# Patient Record
Sex: Male | Born: 1970 | Race: White | Hispanic: No | Marital: Single | Smoking: Current every day smoker
Health system: Southern US, Community
[De-identification: ages and names within clinical notes are randomized; demographics above are authoritative.]

## PROBLEM LIST (undated history)

## (undated) DIAGNOSIS — E559 Vitamin D deficiency, unspecified: Secondary | ICD-10-CM

## (undated) DIAGNOSIS — Z720 Tobacco use: Secondary | ICD-10-CM

## (undated) DIAGNOSIS — M79609 Pain in unspecified limb: Secondary | ICD-10-CM

## (undated) DIAGNOSIS — E785 Hyperlipidemia, unspecified: Secondary | ICD-10-CM

## (undated) HISTORY — DX: Pain in unspecified limb: M79.609

## (undated) HISTORY — PX: NO PAST SURGERIES: SHX2092

## (undated) HISTORY — DX: Tobacco use: Z72.0

## (undated) HISTORY — DX: Vitamin D deficiency, unspecified: E55.9

---

## 2000-11-21 ENCOUNTER — Encounter: Payer: Self-pay | Admitting: Family Medicine

## 2000-11-21 ENCOUNTER — Ambulatory Visit (HOSPITAL_COMMUNITY): Admission: RE | Admit: 2000-11-21 | Discharge: 2000-11-21 | Payer: Self-pay | Admitting: Family Medicine

## 2005-01-17 ENCOUNTER — Emergency Department (HOSPITAL_COMMUNITY): Admission: EM | Admit: 2005-01-17 | Discharge: 2005-01-17 | Payer: Self-pay | Admitting: *Deleted

## 2005-06-27 ENCOUNTER — Ambulatory Visit: Payer: Self-pay | Admitting: Nurse Practitioner

## 2006-09-23 ENCOUNTER — Emergency Department (HOSPITAL_COMMUNITY): Admission: EM | Admit: 2006-09-23 | Discharge: 2006-09-23 | Payer: Self-pay | Admitting: Emergency Medicine

## 2007-02-17 ENCOUNTER — Emergency Department (HOSPITAL_COMMUNITY): Admission: EM | Admit: 2007-02-17 | Discharge: 2007-02-17 | Payer: Self-pay | Admitting: Emergency Medicine

## 2007-02-20 ENCOUNTER — Emergency Department (HOSPITAL_COMMUNITY): Admission: EM | Admit: 2007-02-20 | Discharge: 2007-02-20 | Payer: Self-pay | Admitting: Emergency Medicine

## 2007-02-21 ENCOUNTER — Emergency Department (HOSPITAL_COMMUNITY): Admission: EM | Admit: 2007-02-21 | Discharge: 2007-02-21 | Payer: Self-pay | Admitting: Emergency Medicine

## 2009-05-01 ENCOUNTER — Emergency Department (HOSPITAL_COMMUNITY): Admission: EM | Admit: 2009-05-01 | Discharge: 2009-05-01 | Payer: Self-pay | Admitting: Emergency Medicine

## 2009-05-03 ENCOUNTER — Emergency Department (HOSPITAL_COMMUNITY): Admission: EM | Admit: 2009-05-03 | Discharge: 2009-05-03 | Payer: Self-pay | Admitting: Emergency Medicine

## 2009-05-05 ENCOUNTER — Emergency Department (HOSPITAL_COMMUNITY): Admission: EM | Admit: 2009-05-05 | Discharge: 2009-05-05 | Payer: Self-pay | Admitting: Emergency Medicine

## 2009-05-27 ENCOUNTER — Ambulatory Visit (HOSPITAL_BASED_OUTPATIENT_CLINIC_OR_DEPARTMENT_OTHER): Admission: RE | Admit: 2009-05-27 | Discharge: 2009-05-27 | Payer: Self-pay | Admitting: General Surgery

## 2009-05-27 ENCOUNTER — Encounter (INDEPENDENT_AMBULATORY_CARE_PROVIDER_SITE_OTHER): Payer: Self-pay | Admitting: General Surgery

## 2011-03-20 LAB — CULTURE, ROUTINE-ABSCESS
Culture: NO GROWTH
Gram Stain: NONE SEEN

## 2011-03-20 LAB — ANAEROBIC CULTURE: Gram Stain: NONE SEEN

## 2011-03-20 LAB — POCT HEMOGLOBIN-HEMACUE: Hemoglobin: 15.6 g/dL (ref 13.0–17.0)

## 2011-03-21 LAB — WOUND CULTURE: Culture: NO GROWTH

## 2011-04-25 NOTE — Op Note (Signed)
NAME:  Collin Miller, Collin Miller                  ACCOUNT NO.:  [id number]   MEDICAL RECORD NO.:  [id number]          PATIENT TYPE:  AMB   LOCATION:  DSC                          FACILITY:  [facility]   PHYSICIAN:  Almond Lint, MD       DATE OF BIRTH:  11-Jul-1971   DATE OF PROCEDURE:  DATE OF DISCHARGE:                               OPERATIVE REPORT   PREOPERATIVE DIAGNOSIS:  Right neck mass.   POSTOPERATIVE DIAGNOSIS:  Right neck mass.   PROCEDURE PERFORMED:  Excision of right neck mass.   ANESTHESIA:  General and local.   FINDINGS:  Probable epidermal inclusion cyst with pus.   SPECIMEN:  Right neck mass to Pathology.  Cultures to Micro.   ESTIMATED BLOOD LOSS:  Minimal.   COMPLICATIONS:  None known.   PROCEDURE:  Collin Miller was identified in the holding area and taken to  the operating room where he was placed supine on the operating room  table.  General LMA anesthesia was induced.  The patient's neck was  rotated to the left and clipped, prepped, and draped in a sterile  fashion.  Time-out was performed according to the surgical safety check  list.  When all was correct, we continued.  An ellipse of skin was drawn  around the mass to include the prior drainage site.  This was made with  a #15 blade.  The patient was noted to be extremely oozy.  Despite the  large ellipse of skin, the cyst was entered very superficially.  This  was cultured.  An Allis clamp was used to elevate the skin overlying the  cyst, and the Bovie was used to create flaps.  Once we got underneath  the mass, the flaps were created on the other side.  The mass was then  removed in total with the Bovie.  The skin edges were extremely friable  as well in that part of the skin that had been inspected, and these were  trimmed to create a healthy tissue.  The hemostasis was achieved with  the cautery.  The cavity was irrigated copiously.  Of note, the cyst was  noted to be intradermal and did not extend below the platysma.   The mass  was approximately 1.5 x 2 cm.  The skin was closed with horizontal  mattress sutures and 4-0 nylon.  In this regard, it pulled through the  skin together nicely and helped control the oozing from the skin edges  that occurred with each stitch.  The skin was anesthetized with  lidocaine, mixed with 0.25% Marcaine with epinephrine.  Once the sutures  were in place, the wound was hemostatic.  The area was cleaned, dried,  and dressed with gauze and tape.  The patient was awakened from  anesthesia and taken to the PACU in stable condition.     Almond Lint, MD  Electronically Signed    FB/MEDQ  D:  05/27/2009  T:  05/28/2009  Job:  [id number]

## 2013-04-01 ENCOUNTER — Encounter (HOSPITAL_COMMUNITY): Payer: Self-pay | Admitting: *Deleted

## 2013-04-01 ENCOUNTER — Emergency Department (HOSPITAL_COMMUNITY): Payer: Medicare Other

## 2013-04-01 ENCOUNTER — Emergency Department (HOSPITAL_COMMUNITY)
Admission: EM | Admit: 2013-04-01 | Discharge: 2013-04-01 | Disposition: A | Discharge status: Home / Self Care | Payer: Medicare Other | Attending: Emergency Medicine | Admitting: Emergency Medicine

## 2013-04-01 DIAGNOSIS — S93509A Unspecified sprain of unspecified toe(s), initial encounter: Secondary | ICD-10-CM

## 2013-04-01 DIAGNOSIS — Y92009 Unspecified place in unspecified non-institutional (private) residence as the place of occurrence of the external cause: Secondary | ICD-10-CM | POA: Insufficient documentation

## 2013-04-01 DIAGNOSIS — F17200 Nicotine dependence, unspecified, uncomplicated: Secondary | ICD-10-CM | POA: Insufficient documentation

## 2013-04-01 DIAGNOSIS — S93609A Unspecified sprain of unspecified foot, initial encounter: Secondary | ICD-10-CM | POA: Insufficient documentation

## 2013-04-01 DIAGNOSIS — W010XXA Fall on same level from slipping, tripping and stumbling without subsequent striking against object, initial encounter: Secondary | ICD-10-CM | POA: Insufficient documentation

## 2013-04-01 DIAGNOSIS — Y939 Activity, unspecified: Secondary | ICD-10-CM | POA: Insufficient documentation

## 2013-04-01 NOTE — ED Notes (Signed)
Pt reports tripping and injuring left toe yesterday. Pain 10/10.

## 2013-04-01 NOTE — ED Provider Notes (Signed)
Medical screening examination/treatment/procedure(s) were performed by non-physician practitioner and as supervising physician I was immediately available for consultation/collaboration.   Lyanne Co, MD 04/01/13 1027

## 2013-04-01 NOTE — ED Provider Notes (Signed)
History     CSN: [id number]  Arrival date & time 04/01/13  4098   First MD Initiated Contact with Patient 04/01/13 [phone]      Chief Complaint  Patient presents with  . Toe Injury    (Consider location/radiation/quality/duration/timing/severity/associated sxs/prior treatment) HPI Comments: 42 year old male with no significant past medical history presents to the emergency department complaining of pain in his left second toe after tripping over a garden hose one day ago. Patient states he was out in the yard wearing shoes when this occurred. Pain yesterday was "severe", took 2 Advil and pain decreased to 2/10, worse with walking. Admits to bruising and swelling around his toe. Denies numbness or tingling.   The history is provided by the patient.    History reviewed. No pertinent past medical history.  History reviewed. No pertinent past surgical history.  History reviewed. No pertinent family history.  History  Substance Use Topics  . Smoking status: Current Every Day Smoker -- 0.50 packs/day for 20 years    Types: Cigarettes  . Smokeless tobacco: Not on file  . Alcohol Use: No      Review of Systems  Musculoskeletal: Positive for joint swelling.       Positive for left toe pain.  Skin: Positive for color change.  Neurological: Negative for numbness.  All other systems reviewed and are negative.    Allergies  Review of patient's allergies indicates no known allergies.  Home Medications  No current outpatient prescriptions on file.  There were no vitals taken for this visit.  Physical Exam  Nursing note and vitals reviewed. Constitutional: He is oriented to person, place, and time. He appears well-developed and well-nourished. No distress.  HENT:  Head: Normocephalic and atraumatic.  Mouth/Throat: Oropharynx is clear and moist.  Eyes: Conjunctivae and EOM are normal.  Neck: Normal range of motion. Neck supple.  Cardiovascular: Normal rate, regular rhythm,  normal heart sounds and intact distal pulses.   Capillary refill < 3 seconds.  Pulmonary/Chest: Effort normal and breath sounds normal.  Musculoskeletal: Normal range of motion. He exhibits no edema.  TTP of entire left second toe, worse at MTP joint with overlying edema and bruising. Full ROM. No deformity.  Neurological: He is alert and oriented to person, place, and time. No sensory deficit.  Skin: Skin is warm and dry. Bruising noted.  Psychiatric: He has a normal mood and affect. His behavior is normal.    ED Course  Procedures (including critical care time)  Labs Reviewed - No data to display Dg Toe 2nd Left  04/01/2013  *RADIOLOGY REPORT*  Clinical Data: Tripped over something at work, pain and second toe  LEFT SECOND TOE  Comparison: None.  Findings: There is no evidence of fracture or dislocation.  There is no evidence of arthropathy or other focal bony abnormality. Soft tissues are unremarkable.  IMPRESSION: Negative.   Original Report Authenticated By: Davonna Belling, M.D.      1. Toe sprain, initial encounter       MDM  Toe sprain- xray negative. Post-op shoe for comfort. Advised rest, ice, elevation, ibuprofen. Patient states understanding of plan and is agreeable.         Trevor Mace, PA-C 04/01/13 1009

## 2015-09-08 ENCOUNTER — Ambulatory Visit (HOSPITAL_COMMUNITY)
Admission: RE | Admit: 2015-09-08 | Discharge: 2015-09-08 | Disposition: A | Discharge status: Home / Self Care | Payer: Medicare Other | Source: Ambulatory Visit | Attending: Internal Medicine | Admitting: Internal Medicine

## 2015-09-08 ENCOUNTER — Other Ambulatory Visit (HOSPITAL_COMMUNITY): Payer: Self-pay | Admitting: Internal Medicine

## 2015-09-08 DIAGNOSIS — M25561 Pain in right knee: Secondary | ICD-10-CM

## 2015-10-27 ENCOUNTER — Ambulatory Visit (HOSPITAL_COMMUNITY)
Admission: RE | Admit: 2015-10-27 | Discharge: 2015-10-27 | Disposition: A | Discharge status: Home / Self Care | Payer: Medicare Other | Source: Ambulatory Visit | Attending: Internal Medicine | Admitting: Internal Medicine

## 2015-10-27 ENCOUNTER — Other Ambulatory Visit (HOSPITAL_COMMUNITY): Payer: Self-pay | Admitting: Internal Medicine

## 2015-10-27 DIAGNOSIS — M545 Low back pain: Secondary | ICD-10-CM

## 2015-10-27 DIAGNOSIS — M5137 Other intervertebral disc degeneration, lumbosacral region: Secondary | ICD-10-CM | POA: Insufficient documentation

## 2018-03-21 ENCOUNTER — Emergency Department (HOSPITAL_COMMUNITY)
Admission: EM | Admit: 2018-03-21 | Discharge: 2018-03-21 | Discharge status: Absent without leave | Payer: Medicare Other | Source: Home / Self Care

## 2018-03-21 ENCOUNTER — Ambulatory Visit (HOSPITAL_COMMUNITY)
Admission: RE | Admit: 2018-03-21 | Discharge: 2018-03-21 | Disposition: A | Discharge status: Home / Self Care | Payer: Medicare Other | Source: Ambulatory Visit | Attending: Internal Medicine | Admitting: Internal Medicine

## 2018-03-21 ENCOUNTER — Other Ambulatory Visit (HOSPITAL_COMMUNITY): Payer: Self-pay | Admitting: Internal Medicine

## 2018-03-21 DIAGNOSIS — Z720 Tobacco use: Secondary | ICD-10-CM

## 2018-03-21 DIAGNOSIS — J984 Other disorders of lung: Secondary | ICD-10-CM | POA: Diagnosis not present

## 2018-05-06 ENCOUNTER — Emergency Department (HOSPITAL_COMMUNITY): Payer: Medicare Other

## 2018-05-06 ENCOUNTER — Emergency Department (HOSPITAL_COMMUNITY)
Admission: EM | Admit: 2018-05-06 | Discharge: 2018-05-06 | Disposition: A | Discharge status: Home / Self Care | Payer: Medicare Other | Attending: Emergency Medicine | Admitting: Emergency Medicine

## 2018-05-06 ENCOUNTER — Encounter (HOSPITAL_COMMUNITY): Payer: Self-pay | Admitting: *Deleted

## 2018-05-06 DIAGNOSIS — F17210 Nicotine dependence, cigarettes, uncomplicated: Secondary | ICD-10-CM | POA: Insufficient documentation

## 2018-05-06 DIAGNOSIS — Y929 Unspecified place or not applicable: Secondary | ICD-10-CM | POA: Diagnosis not present

## 2018-05-06 DIAGNOSIS — S20211A Contusion of right front wall of thorax, initial encounter: Secondary | ICD-10-CM | POA: Insufficient documentation

## 2018-05-06 DIAGNOSIS — W010XXA Fall on same level from slipping, tripping and stumbling without subsequent striking against object, initial encounter: Secondary | ICD-10-CM | POA: Diagnosis not present

## 2018-05-06 DIAGNOSIS — Z79899 Other long term (current) drug therapy: Secondary | ICD-10-CM | POA: Diagnosis not present

## 2018-05-06 DIAGNOSIS — T148XXA Other injury of unspecified body region, initial encounter: Secondary | ICD-10-CM

## 2018-05-06 DIAGNOSIS — W19XXXA Unspecified fall, initial encounter: Secondary | ICD-10-CM

## 2018-05-06 DIAGNOSIS — Y939 Activity, unspecified: Secondary | ICD-10-CM | POA: Diagnosis not present

## 2018-05-06 DIAGNOSIS — S299XXA Unspecified injury of thorax, initial encounter: Secondary | ICD-10-CM | POA: Diagnosis present

## 2018-05-06 DIAGNOSIS — M7981 Nontraumatic hematoma of soft tissue: Secondary | ICD-10-CM | POA: Diagnosis not present

## 2018-05-06 DIAGNOSIS — Y999 Unspecified external cause status: Secondary | ICD-10-CM | POA: Diagnosis not present

## 2018-05-06 MED ORDER — ACETAMINOPHEN 500 MG PO TABS
1000.0000 mg | ORAL_TABLET | Freq: Once | ORAL | Status: AC
  Administered 2018-05-06: 1000 mg via ORAL
  Filled 2018-05-06: qty 2

## 2018-05-06 NOTE — ED Triage Notes (Signed)
Pt states he slipped and fell at work 2 days ago, now complains of pain in right rib cage area and right hip. Pt denies loss of consciousness or head injury.

## 2018-05-06 NOTE — ED Provider Notes (Signed)
[facility] DEPT Provider Note  CSN: [id number] Arrival date & time: 05/06/18 6010  Chief Complaint(s) Leg Pain; rib cage pain (right); and Fall  HPI Collin Miller is a 47 y.o. male with no pertinent past medical history presents to the emergency department with right rib, right forearm, right upper leg pain following a mechanical fall 2 days ago.  Patient reports that he slipped on wet floor while at work causing him to fall onto his right side.  He denies any head trauma or loss of consciousness.  Pain is been persistent since onset but gradually improving.  Has been taking Motrin for the pain.  Pain is exacerbated with palpation of these regions.  No other alleviating or aggravating factor.  He denies any headache, neck pain, back pain.  No nausea or vomiting.  HPI  Past Medical History History reviewed. No pertinent past medical history. There are no active problems to display for this patient.  Home Medication(s) Prior to Admission medications   Medication Sig Start Date End Date Taking? Authorizing Provider  ibuprofen (ADVIL,MOTRIN) 200 MG tablet Take 200 mg by mouth every 6 (six) hours as needed for pain (pain).    [provider]                                                                                                                                    Past Surgical History History reviewed. No pertinent surgical history. Family History No family history on file.  Social History Social History   Tobacco Use  . Smoking status: Current Every Day Smoker    Packs/day: 0.50    Years: 20.00    Pack years: 10.00    Types: Cigarettes  Substance Use Topics  . Alcohol use: No  . Drug use: No   Allergies Patient has no known allergies.  Review of Systems Review of Systems All other systems are reviewed and are negative for acute change except as noted in the HPI  Physical Exam Vital Signs  I have reviewed the triage vital  signs BP (!) 115/95   Pulse 62   Temp 98 F (36.7 C) (Oral)   Resp 16   SpO2 99%   Physical Exam  Constitutional: He is oriented to person, place, and time. He appears well-developed and well-nourished. No distress.  HENT:  Head: Normocephalic.  Right Ear: External ear normal.  Left Ear: External ear normal.  Mouth/Throat: Oropharynx is clear and moist.  Eyes: Pupils are equal, round, and reactive to light. Conjunctivae and EOM are normal. Right eye exhibits no discharge. Left eye exhibits no discharge. No scleral icterus.  Neck: Normal range of motion. Neck supple.  Cardiovascular: Regular rhythm and normal heart sounds. Exam reveals no gallop and no friction rub.  No murmur heard. Pulses:      Radial pulses are 2+ on the right side, and 2+ on the left side.  Dorsalis pedis pulses are 2+ on the right side, and 2+ on the left side.  Pulmonary/Chest: Effort normal and breath sounds normal. No stridor. No respiratory distress. He exhibits tenderness.    Abdominal: Soft. He exhibits no distension. There is no tenderness.  Musculoskeletal:       Right elbow: He exhibits normal range of motion and no swelling. No tenderness found.       Right hip: He exhibits normal range of motion, no tenderness and no bony tenderness.       Cervical back: He exhibits no bony tenderness.       Thoracic back: He exhibits no bony tenderness.       Lumbar back: He exhibits no bony tenderness.       Right forearm: He exhibits no tenderness.       Arms:      Right upper leg: He exhibits no tenderness.  Clavicle stable. Chest stable to AP/Lat compression. Pelvis stable to Lat compression. No obvious extremity deformity. No chest or abdominal wall contusion.  Neurological: He is alert and oriented to person, place, and time. GCS eye subscore is 4. GCS verbal subscore is 5. GCS motor subscore is 6.  Moving all extremities   Skin: Skin is warm. He is not diaphoretic.    ED Results and  Treatments Labs (all labs ordered are listed, but only abnormal results are displayed) Labs Reviewed - No data to display                                                                                                                       EKG  EKG Interpretation  Date/Time:    Ventricular Rate:    PR Interval:    QRS Duration:   QT Interval:    QTC Calculation:   R Axis:     Text Interpretation:        Radiology Dg Ribs Unilateral W/chest Right  Result Date: 05/06/2018 CLINICAL DATA:  Septum fell at work 2 days ago. Right anterior chest wall pain. EXAM: RIGHT RIBS AND CHEST - 3+ VIEW COMPARISON:  03/21/2018 FINDINGS: No rib fracture or rib lesion. Normal heart, mediastinum and hila. Clear lungs. No pleural effusion or pneumothorax. IMPRESSION: Negative. Electronically Signed   By: Lajean Manes M.D.   On: 05/06/2018 12:19   Pertinent labs & imaging results that were available during my care of the patient were reviewed by me and considered in my medical decision making (see chart for details).  Medications Ordered in ED Medications  acetaminophen (TYLENOL) tablet 1,000 mg (1,000 mg Oral Given 05/06/18 1239)  Procedures Procedures  (including critical care time)  Medical Decision Making / ED Course I have reviewed the nursing notes for this encounter and the patient's prior records (if available in EHR or on provided paperwork).    Mechanical fall resulting in right rib, right forearm, right upper leg pain.  Likely muscular contusion.  Plain film of the right ribs without evidence of fracture.  No significant tenderness to palpation concerning for fracture dislocation.  His pain appears to be soft tissue in nature.  Recommended continued Motrin use as well as applying ice.  The patient appears reasonably screened and/or stabilized for discharge and I  doubt any other medical condition or other [facility] requiring further screening, evaluation, or treatment in the ED at this time prior to discharge.  The patient is safe for discharge with strict return precautions.   Final Clinical Impression(s) / ED Diagnoses Final diagnoses:  Fall, initial encounter  Contusion of right front wall of thorax, initial encounter  Contusion of soft tissue   Disposition: Discharge  Condition: Good  I have discussed the results, Dx and Tx plan with the patient who expressed understanding and agree(s) with the plan. Discharge instructions discussed at great length. The patient was given strict return precautions who verbalized understanding of the instructions. No further questions at time of discharge.    ED Discharge Orders    None       Follow Up: Rogers Blocker, MD [place] [id number] [phone]  Schedule an appointment as soon as possible for a visit        This chart was dictated using voice recognition software.  Despite best efforts to proofread,  errors can occur which can change the documentation meaning.   Fatima Blank, MD 05/06/18 1311

## 2018-05-06 NOTE — Discharge Instructions (Addendum)
You may use over-the-counter Motrin (Ibuprofen), Acetaminophen (Tylenol), topical muscle creams such as SalonPas, Icy Hot, Bengay, etc. Please stretch, apply heat, and have massage therapy for additional assistance. ° °

## 2018-11-15 ENCOUNTER — Emergency Department (HOSPITAL_COMMUNITY)
Admission: EM | Admit: 2018-11-15 | Discharge: 2018-11-15 | Disposition: A | Discharge status: Home / Self Care | Payer: Medicare Other | Attending: Emergency Medicine | Admitting: Emergency Medicine

## 2018-11-15 ENCOUNTER — Encounter (HOSPITAL_COMMUNITY): Payer: Self-pay | Admitting: Emergency Medicine

## 2018-11-15 DIAGNOSIS — R55 Syncope and collapse: Secondary | ICD-10-CM | POA: Diagnosis present

## 2018-11-15 DIAGNOSIS — F17210 Nicotine dependence, cigarettes, uncomplicated: Secondary | ICD-10-CM | POA: Insufficient documentation

## 2018-11-15 HISTORY — DX: Hyperlipidemia, unspecified: E78.5

## 2018-11-15 LAB — CBG MONITORING, ED: Glucose-Capillary: 84 mg/dL (ref 70–99)

## 2018-11-15 NOTE — ED Provider Notes (Signed)
[facility] DEPT Provider Note   CSN: [id number] Arrival date & time: 11/15/18  1152     History   Chief Complaint Chief Complaint  Patient presents with  . Loss of Consciousness    HPI Collin Miller is a 47 y.o. male.  Patient was brought in by ambulance from his primary care doctor's office after he had a syncopal event while having blood drawn.  During the blood draw he said he felt acutely lightheaded and nauseous and sweaty and then fell to the floor.  He thinks he was out no longer than a minute and feels back to baseline now.  EMS apparently gave him 500 cc bolus in route.  He was at the doctor's office for routine appointment and had not had anything to eat today.  He is never had a syncopal event before.  There was no preceding chest pain.  He currently has no complaints and feels back to baseline.  The history is provided by the patient.  Loss of Consciousness   This is a new problem. The current episode started less than 1 hour ago. The problem has been resolved. He lost consciousness for a period of less than one minute. Associated with: blood draw. Associated symptoms include diaphoresis, light-headedness and nausea. Pertinent negatives include abdominal pain, bladder incontinence, bowel incontinence, chest pain, fever and focal weakness. He has tried certain positions for the symptoms. The treatment provided significant relief.    Past Medical History:  Diagnosis Date  . Hyperlipidemia     There are no active problems to display for this patient.   History reviewed. No pertinent surgical history.      Home Medications    Prior to Admission medications   Medication Sig Start Date End Date Taking? Authorizing Provider  ibuprofen (ADVIL,MOTRIN) 200 MG tablet Take 200 mg by mouth every 6 (six) hours as needed for pain (pain).    [provider]    Family History No family history on file.  Social History Social History    Tobacco Use  . Smoking status: Current Every Day Smoker    Packs/day: 0.50    Years: 20.00    Pack years: 10.00    Types: Cigarettes  Substance Use Topics  . Alcohol use: No  . Drug use: No     Allergies   Patient has no known allergies.   Review of Systems Review of Systems  Constitutional: Positive for diaphoresis. Negative for fever.  HENT: Negative for sore throat.   Eyes: Negative for visual disturbance.  Respiratory: Negative for shortness of breath.   Cardiovascular: Positive for syncope. Negative for chest pain.  Gastrointestinal: Positive for nausea. Negative for abdominal pain and bowel incontinence.  Genitourinary: Negative for bladder incontinence and dysuria.  Musculoskeletal: Negative for neck pain.  Skin: Negative for rash.  Neurological: Positive for syncope and light-headedness. Negative for focal weakness.     Physical Exam Updated Vital Signs BP 113/83 (BP Location: Right Arm)   Pulse 81   Temp (!) 97.2 F (36.2 C) (Oral)   Resp 17   SpO2 100%   Physical Exam  Constitutional: He is oriented to person, place, and time. He appears well-developed and well-nourished.  HENT:  Head: Normocephalic and atraumatic.  Eyes: Conjunctivae are normal.  Neck: Neck supple.  Cardiovascular: Normal rate, regular rhythm and normal heart sounds.  No murmur heard. Pulmonary/Chest: Effort normal and breath sounds normal. No respiratory distress.  Abdominal: Soft. There is no tenderness.  Musculoskeletal: He exhibits no edema, tenderness or deformity.  Neurological: He is alert and oriented to person, place, and time. He has normal strength. No cranial nerve deficit or sensory deficit. GCS eye subscore is 4. GCS verbal subscore is 5. GCS motor subscore is 6.  Skin: Skin is warm and dry.  Psychiatric: He has a normal mood and affect.  Nursing note and vitals reviewed.    ED Treatments / Results  Labs (all labs ordered are listed, but only abnormal results  are displayed) Labs Reviewed  CBG MONITORING, ED    EKG EKG Interpretation  Date/Time:  Friday November 15 2018 12:17:28 EST Ventricular Rate:  76 PR Interval:  126 QRS Duration: 94 QT Interval:  392 QTC Calculation: 441 R Axis:   69 Text Interpretation:  Normal sinus rhythm Normal ECG no prior to compare with Confirmed by Aletta Edouard [phone]) on 11/15/2018 12:27:33 PM   Radiology No results found.  Procedures Procedures (including critical care time)  Medications Ordered in ED Medications - No data to display   Initial Impression / Assessment and Plan / ED Course  I have reviewed the triage vital signs and the nursing notes.  Pertinent labs & imaging results that were available during my care of the patient were reviewed by me and considered in my medical decision making (see chart for details).  Clinical Course as of Nov 15 1529  Fri Nov 16, 5719  6792 47 year old male with no significant medical history is here after a syncopal event while getting blood drawn at his primary care doctor's office.  He is a normal-appearing EKG and he otherwise feels back to baseline.  I think this is likely vagal and does not require extensive testing.  We will check a blood sugar and check orthostatics.   [MB]    Clinical Course User Index [MB] Hayden Rasmussen, MD    Final Clinical Impressions(s) / ED Diagnoses   Final diagnoses:  Vasovagal syncope    ED Discharge Orders    None       Hayden Rasmussen, MD 11/15/18 1531

## 2018-11-15 NOTE — Discharge Instructions (Addendum)
You were evaluated in the emergency department for a fainting spell that occurred while you were in your primary care doctor's office getting blood drawn.  Your EKG here was unremarkable along with a normal blood sugar.  Your vital signs showed that your blood pressure remained stable.  Will be important for you to get some extra fluids and food in your system.  Follow-up with your doctor and return if any concerns.

## 2018-11-15 NOTE — ED Triage Notes (Signed)
Per EMS-states patient was having labs drawn afterwards he was standing at a desk and passed out-patient fell to floor-500 cc bolus given in route-no injury from falling-patient states he feels fine now

## 2018-11-15 NOTE — ED Notes (Signed)
Bed: [facility] Expected date:  Expected time:  Means of arrival:  Comments: EMS-syncope during blood draw

## 2021-01-15 ENCOUNTER — Emergency Department (HOSPITAL_COMMUNITY): Payer: Medicare Other

## 2021-01-15 ENCOUNTER — Other Ambulatory Visit: Payer: Self-pay

## 2021-01-15 ENCOUNTER — Emergency Department (HOSPITAL_COMMUNITY)
Admission: EM | Admit: 2021-01-15 | Discharge: 2021-01-15 | Disposition: A | Discharge status: Home / Self Care | Payer: Medicare Other | Attending: Emergency Medicine | Admitting: Emergency Medicine

## 2021-01-15 DIAGNOSIS — R42 Dizziness and giddiness: Secondary | ICD-10-CM | POA: Insufficient documentation

## 2021-01-15 DIAGNOSIS — F17210 Nicotine dependence, cigarettes, uncomplicated: Secondary | ICD-10-CM | POA: Diagnosis not present

## 2021-01-15 DIAGNOSIS — R55 Syncope and collapse: Secondary | ICD-10-CM | POA: Insufficient documentation

## 2021-01-15 LAB — CBC
HCT: 49.3 % (ref 39.0–52.0)
Hemoglobin: 17 g/dL (ref 13.0–17.0)
MCH: 30.6 pg (ref 26.0–34.0)
MCHC: 34.5 g/dL (ref 30.0–36.0)
MCV: 88.7 fL (ref 80.0–100.0)
Platelets: 317 10*3/uL (ref 150–400)
RBC: 5.56 MIL/uL (ref 4.22–5.81)
RDW: 12.3 % (ref 11.5–15.5)
WBC: 13.5 10*3/uL — ABNORMAL HIGH (ref 4.0–10.5)
nRBC: 0 % (ref 0.0–0.2)

## 2021-01-15 LAB — BASIC METABOLIC PANEL
Anion gap: 13 (ref 5–15)
BUN: 11 mg/dL (ref 6–20)
CO2: 20 mmol/L — ABNORMAL LOW (ref 22–32)
Calcium: 9.3 mg/dL (ref 8.9–10.3)
Chloride: 105 mmol/L (ref 98–111)
Creatinine, Ser: 0.99 mg/dL (ref 0.61–1.24)
GFR, Estimated: >60 mL/min (ref >60)
Glucose, Bld: 125 mg/dL — ABNORMAL HIGH (ref 70–99)
Potassium: 3.8 mmol/L (ref 3.5–5.1)
Sodium: 138 mmol/L (ref 135–145)

## 2021-01-15 LAB — TROPONIN I (HIGH SENSITIVITY)
Troponin I (High Sensitivity): 5 ng/L (ref <18)
Troponin I (High Sensitivity): 3 ng/L (ref <18)

## 2021-01-15 LAB — ETHANOL: Alcohol, Ethyl (B): <10 mg/dL (ref <10)

## 2021-01-15 MED ORDER — SODIUM CHLORIDE 0.9 % IV BOLUS
1000.0000 mL | Freq: Once | INTRAVENOUS | Status: AC
  Administered 2021-01-15: 1000 mL via INTRAVENOUS

## 2021-01-15 NOTE — ED Triage Notes (Signed)
Pt presents to ED POV. Pt visiting other family member in ED lobby. Pt had syncopal event. Pt has hematoma to back of head. Dr. Leonette Monarch seeing pt in triage

## 2021-01-15 NOTE — ED Provider Notes (Signed)
[facility] EMERGENCY DEPARTMENT Provider Note  CSN: [id number] Arrival date & time: 01/15/21 0019  Chief Complaint(s) Loss of Consciousness  HPI Collin Miller is a 50 y.o. male who was accompanying his mother here in the emergency department was brought back after syncopal episode in the waiting room.  Patient reports waiting with his mother for over 8 hours.  Reports that he had not had anything to eat or drink since earlier in the morning.  Reported he was standing at the door for several hours when he became lightheaded, dizzy, had tunnel vision prior to passing out.  Upon syncopized in the patient's fell and hit his head on the floor.  He was immediately brought back noted to be hypotensive with systolics in the 65H.  Patient had hematoma in the back of his head.  He denied any headache, neck pain,, back pain, chest pain, shortness of breath.  He denied any recent fevers or infections.  No coughing or congestion.  No nausea or vomiting.  No diarrhea.  Patient reports prior vasovagal episodes in the past.  Due to prolonged wait times in ED volume, patient was seen in the triage.  HPI  Past Medical History Past Medical History:  Diagnosis Date  . Hyperlipidemia    There are no problems to display for this patient.  Home Medication(s) Prior to Admission medications   Medication Sig Start Date End Date Taking? Authorizing Provider  ibuprofen (ADVIL,MOTRIN) 200 MG tablet Take 200 mg by mouth every 6 (six) hours as needed for pain (pain).    [provider]                                                                                                                                    Past Surgical History No past surgical history on file. Family History No family history on file.  Social History Social History   Tobacco Use  . Smoking status: Current Every Day Smoker    Packs/day: 0.50    Years: 20.00    Pack years: 10.00    Types: Cigarettes   Substance Use Topics  . Alcohol use: No  . Drug use: No   Allergies Patient has no known allergies.  Review of Systems Review of Systems All other systems are reviewed and are negative for acute change except as noted in the HPI  Physical Exam Vital Signs  I have reviewed the triage vital signs BP 126/86   Pulse 100   Temp 98.5 F (36.9 C) (Oral)   Resp 16   SpO2 98%   Physical Exam Vitals reviewed.  Constitutional:      General: He is not in acute distress.    Appearance: He is well-developed and well-nourished. He is not diaphoretic.  HENT:     Head: Normocephalic and atraumatic.     Nose: Nose normal.  Eyes:     General: No scleral icterus.  Right eye: No discharge.        Left eye: No discharge.     Extraocular Movements: EOM normal.     Conjunctiva/sclera: Conjunctivae normal.     Pupils: Pupils are equal, round, and reactive to light.  Cardiovascular:     Rate and Rhythm: Normal rate and regular rhythm.     Heart sounds: No murmur heard. No friction rub. No gallop.   Pulmonary:     Effort: Pulmonary effort is normal. No respiratory distress.     Breath sounds: Normal breath sounds. No stridor. No rales.  Abdominal:     General: There is no distension.     Palpations: Abdomen is soft.     Tenderness: There is no abdominal tenderness.  Musculoskeletal:        General: No tenderness or edema.     Cervical back: Normal range of motion and neck supple.  Skin:    General: Skin is warm and dry.     Coloration: Skin is pale.     Findings: No erythema or rash.  Neurological:     Mental Status: He is alert and oriented to person, place, and time.  Psychiatric:        Mood and Affect: Mood and affect normal.     ED Results and Treatments Labs (all labs ordered are listed, but only abnormal results are displayed) Labs Reviewed  BASIC METABOLIC PANEL - Abnormal; Notable for the following components:      Result Value   CO2 20 (*)    Glucose, Bld 125  (*)    All other components within normal limits  CBC - Abnormal; Notable for the following components:   WBC 13.5 (*)    All other components within normal limits  ETHANOL  URINALYSIS, ROUTINE W REFLEX MICROSCOPIC  CBG MONITORING, ED  TROPONIN I (HIGH SENSITIVITY)  TROPONIN I (HIGH SENSITIVITY)                                                                                                                         EKG  Media Information         Document Information  Photos  Ekg   01/15/2021 06:54  Attached To:  Dominica Severin T Amodei   Source Information  Aunika Kirsten, Grayce Sessions, MD  [facility]    Radiology CT Head Wo Contrast  Result Date: 01/15/2021 CLINICAL DATA:  Head trauma.  Normal mental status. EXAM: CT HEAD WITHOUT CONTRAST TECHNIQUE: Contiguous axial images were obtained from the base of the skull through the vertex without intravenous contrast. COMPARISON:  None. FINDINGS: Brain: The brain shows a normal appearance without evidence of malformation, atrophy, old or acute small or large vessel infarction, mass lesion, hemorrhage, hydrocephalus or extra-axial collection. Vascular: No hyperdense vessel. No evidence of atherosclerotic calcification. Skull: Normal.  No traumatic finding.  No focal bone lesion. Sinuses/Orbits: Sinuses are clear. Orbits appear normal. Mastoids are clear. Other: None significant IMPRESSION: Normal head CT. Electronically Signed  By: Nelson Chimes M.D.   On: 01/15/2021 02:31    Pertinent labs & imaging results that were available during my care of the patient were reviewed by me and considered in my medical decision making (see chart for details).  Medications Ordered in ED Medications  sodium chloride 0.9 % bolus 1,000 mL (0 mLs Intravenous Stopped 01/15/21 0214)                                                                                                                                    Procedures Procedures  (including critical care  time)  Medical Decision Making / ED Course I have reviewed the nursing notes for this encounter and the patient's prior records (if available in EHR or on provided paperwork).   Collin Miller was evaluated in Emergency Department on 01/15/2021 for the symptoms described in the history of present illness. He was evaluated in the context of the global COVID-19 pandemic, which necessitated consideration that the patient might be at risk for infection with the SARS-CoV-2 virus that causes COVID-19. Institutional protocols and algorithms that pertain to the evaluation of patients at risk for COVID-19 are in a state of rapid change based on information released by regulatory bodies including the CDC and federal and state organizations. These policies and algorithms were followed during the patient's care in the ED.  Syncopal episode. Patient is hypotensive. No associated chest pain or shortness of breath. Vasovagal prodromal symptoms. Prior episodes in the past.  Likely the etiology. I placed an IV and started patient on IV fluids. Screening labs were obtained. CBG within normal limits EKG without acute ischemic changes, dysrhythmias, blocks. Serial troponins x2 negative. CT head w/o ICH. Labs grossly reassuring without anemia, significant electrolyte derangements or renal sufficiency      Final Clinical Impression(s) / ED Diagnoses Final diagnoses:  Syncope and collapse   The patient appears reasonably screened and/or stabilized for discharge and I doubt any other medical condition or other [facility] requiring further screening, evaluation, or treatment in the ED at this time prior to discharge. Safe for discharge with strict return precautions.  Disposition: Discharge  Condition: Good  I have discussed the results, Dx and Tx plan with the patient/family who expressed understanding and agree(s) with the plan. Discharge instructions discussed at length. The patient/family was given strict return  precautions who verbalized understanding of the instructions. No further questions at time of discharge.    ED Discharge Orders    None        Follow Up: Rogers Blocker, MD [place] [phone]  Call  To schedule an appointment for close follow up, As needed      This chart was dictated using voice recognition software.  Despite best efforts to proofread,  errors can occur which can change the documentation meaning.   Fatima Blank, MD 01/15/21 [phone]

## 2021-07-26 ENCOUNTER — Encounter: Payer: Self-pay | Admitting: Gastroenterology

## 2021-08-05 ENCOUNTER — Ambulatory Visit: Payer: Medicare Other | Admitting: Gastroenterology

## 2021-08-25 ENCOUNTER — Encounter: Payer: Self-pay | Admitting: Gastroenterology

## 2021-09-27 ENCOUNTER — Ambulatory Visit: Payer: Medicare Other | Admitting: Gastroenterology

## 2021-10-19 ENCOUNTER — Ambulatory Visit (INDEPENDENT_AMBULATORY_CARE_PROVIDER_SITE_OTHER): Payer: Medicare Other | Admitting: Gastroenterology

## 2021-10-19 ENCOUNTER — Encounter: Payer: Self-pay | Admitting: Gastroenterology

## 2021-10-19 VITALS — BP 114/80 | HR 72 | Ht 71.0 in | Wt 176.0 lb

## 2021-10-19 DIAGNOSIS — R195 Other fecal abnormalities: Secondary | ICD-10-CM | POA: Diagnosis not present

## 2021-10-19 MED ORDER — SUTAB 1479-225-188 MG PO TABS: 1.0000 | ORAL_TABLET | Freq: Once | ORAL | 0 refills | Status: AC

## 2021-10-19 NOTE — Progress Notes (Signed)
HPI : Collin Miller is a pleasant 50 year old male with a history of chronic tobacco use and hyperlipidemia who was referred to [place] by Dr. Kevan Ny for positive fecal occult blood test.  I do not have the records of this positive test, but the patient indicates he remembers taking a stool based test at home and returning it to his providers laboratory several months ago being told it was positive.  The patient denies seeing any overt GI bleeding.  No history of bright red blood in the stool or on the toilet paper.  No history of melenic appearing stool.  He denies any chronic GI symptoms such as abdominal pain, constipation or diarrhea.  He has no family history of colon cancer.  He denies any chronic upper GI symptoms such as frequent heartburn, acid regurgitation, nausea/vomiting or dysphagia.  He denies any known cardiopulmonary comorbidities, and denies any symptoms concerning for an underlying diagnosis such as chest pain/pressure, dyspnea, lightheadedness/dizziness, palpitations.  He denies any symptoms related to his chronic tobacco use, to include chronic cough, phlegm production, wheezing.   Past Medical History:  Diagnosis Date   Hyperlipidemia    Limb pain    Tobacco abuse    Vitamin D deficiency      Past Surgical History:  Procedure Laterality Date   NO PAST SURGERIES     Family History  Problem Relation Age of Onset   Hyperlipidemia Mother    Prostate cancer Maternal Uncle    Diabetes Maternal Uncle        x 3   Social History   Tobacco Use   Smoking status: Every Day    Packs/day: 0.50    Years: 20.00    Pack years: 10.00    Types: Cigarettes   Smokeless tobacco: Never  Vaping Use   Vaping Use: Never used  Substance Use Topics   Alcohol use: Yes    Comment: occasional   Drug use: No   Current Outpatient Medications  Medication Sig Dispense Refill   acetaminophen (TYLENOL) 325 MG tablet Take 325-650 mg by mouth as needed.     atorvastatin (LIPITOR) 10 MG  tablet Take 10 mg by mouth daily.     cetirizine (ZYRTEC) 10 MG tablet Take 10 mg by mouth daily.     Cholecalciferol (VITAMIN D3) 10 MCG (400 UNIT) CAPS Take 1 tablet by mouth daily.     ibuprofen (ADVIL,MOTRIN) 200 MG tablet Take 200 mg by mouth as needed for pain (pain).     No current facility-administered medications for this visit.   Allergies  Allergen Reactions   Oxycodone Other (See Comments)    "Makes me feel funny"     Review of Systems: All systems reviewed and negative except where noted in HPI.    No results found.  Physical Exam: BP 114/80 (BP Location: Left Arm, Patient Position: Sitting, Cuff Size: Normal)   Pulse 72   Ht 5\' 11"  (1.803 m)   Wt 176 lb (79.8 kg)   BMI 24.55 kg/m  Constitutional: Pleasant,well-developed, Caucasian male in no acute distress. HEENT: Normocephalic and atraumatic. Conjunctivae are normal. No scleral icterus.  Poor dentition, no loose or cracked teeth Cardiovascular: Normal rate, regular rhythm.  Pulmonary/chest: Effort normal and breath sounds normal. No wheezing, rales or rhonchi. Abdominal: Soft, nondistended, nontender. Bowel sounds active throughout. There are no masses palpable. No hepatomegaly. Extremities: no edema Neurological: Alert and oriented to person place and time. Skin: Skin is warm and dry. No rashes noted. Psychiatric:  Normal mood and affect. Behavior is normal.  CBC    Component Value Date/Time   WBC 13.5 (H) 01/15/2021 0054   RBC 5.56 01/15/2021 0054   HGB 17.0 01/15/2021 0054   HCT 49.3 01/15/2021 0054   PLT 317 01/15/2021 0054   MCV 88.7 01/15/2021 0054   MCH 30.6 01/15/2021 0054   MCHC 34.5 01/15/2021 0054   RDW 12.3 01/15/2021 0054    CMP     Component Value Date/Time   NA 138 01/15/2021 0054   K 3.8 01/15/2021 0054   CL 105 01/15/2021 0054   CO2 20 (L) 01/15/2021 0054   GLUCOSE 125 (H) 01/15/2021 0054   BUN 11 01/15/2021 0054   CREATININE 0.99 01/15/2021 0054   CALCIUM 9.3 01/15/2021 0054    GFRNONAA >60 01/15/2021 0054     ASSESSMENT AND PLAN: 50 year old male with reported positive fecal occult blood test.  No family history of colon cancer, no GI symptoms.  We will schedule patient for routine colonoscopy.  He has no cardiopulmonary comorbidities or symptoms to preclude an elective sedated procedure  Positive fecal occult blood test - Colonoscopy  The details, risks (including bleeding, perforation, infection, missed lesions, medication reactions and possible hospitalization or surgery if complications occur), benefits, and alternatives to colonoscopy with possible biopsy and possible polypectomy were discussed with the patient and he/she consents to proceed.   Yeshua Stryker E. Candis Schatz, MD [facility]  Rogers Blocker, MD

## 2021-10-19 NOTE — Patient Instructions (Signed)
If you are age 50 or older, your body mass index should be between 23-30. Your Body mass index is 24.55 kg/m. If this is out of the aforementioned range listed, please consider follow up with your Primary Care Provider.  If you are age 55 or younger, your body mass index should be between 19-25. Your Body mass index is 24.55 kg/m. If this is out of the aformentioned range listed, please consider follow up with your Primary Care Provider.   You have been scheduled for a colonoscopy. Please follow written instructions given to you at your visit today.  Please pick up your prep supplies at the pharmacy within the next 1-3 days. If you use inhalers (even only as needed), please bring them with you on the day of your procedure.   The  GI providers would like to encourage you to use [facility] to communicate with providers for non-urgent requests or questions.  Due to long hold times on the telephone, sending your provider a message by [facility] may be a faster and more efficient way to get a response.  Please allow 48 business hours for a response.  Please remember that this is for non-urgent requests.   Due to recent changes in healthcare laws, you may see the results of your imaging and laboratory studies on MyChart before your provider has had a chance to review them.  We understand that in some cases there may be results that are confusing or concerning to you. Not all laboratory results come back in the same time frame and the provider may be waiting for multiple results in order to interpret others.  Please give [place] 48 hours in order for your provider to thoroughly review all the results before contacting the office for clarification of your results.   It was a pleasure to see you today!  Thank you for trusting me with your gastrointestinal care!    Scott E.Candis Schatz, MD

## 2021-11-21 ENCOUNTER — Ambulatory Visit (AMBULATORY_SURGERY_CENTER): Payer: Medicare Other | Admitting: Gastroenterology

## 2021-11-21 ENCOUNTER — Encounter: Payer: Self-pay | Admitting: Gastroenterology

## 2021-11-21 ENCOUNTER — Other Ambulatory Visit: Payer: Self-pay

## 2021-11-21 VITALS — BP 100/75 | HR 77 | Temp 97.5°F | Resp 14 | Ht 71.0 in | Wt 176.0 lb

## 2021-11-21 DIAGNOSIS — R195 Other fecal abnormalities: Secondary | ICD-10-CM

## 2021-11-21 DIAGNOSIS — K5140 Inflammatory polyps of colon without complications: Secondary | ICD-10-CM

## 2021-11-21 DIAGNOSIS — D120 Benign neoplasm of cecum: Secondary | ICD-10-CM

## 2021-11-21 DIAGNOSIS — D122 Benign neoplasm of ascending colon: Secondary | ICD-10-CM

## 2021-11-21 HISTORY — PX: COLONOSCOPY: SHX174

## 2021-11-21 MED ORDER — SODIUM CHLORIDE 0.9 % IV SOLN: 500.0000 mL | Freq: Once | INTRAVENOUS | Status: DC

## 2021-11-21 NOTE — Patient Instructions (Signed)
Handout on Polyps given.  YOU HAD AN ENDOSCOPIC PROCEDURE TODAY AT [facility]:   Refer to the procedure report that was given to you for any specific questions about what was found during the examination.  If the procedure report does not answer your questions, please call your gastroenterologist to clarify.  If you requested that your care partner not be given the details of your procedure findings, then the procedure report has been included in a sealed envelope for you to review at your convenience later.  YOU SHOULD EXPECT: Some feelings of bloating in the abdomen. Passage of more gas than usual.  Walking can help get rid of the air that was put into your GI tract during the procedure and reduce the bloating. If you had a lower endoscopy (such as a colonoscopy or flexible sigmoidoscopy) you may notice spotting of blood in your stool or on the toilet paper. If you underwent a bowel prep for your procedure, you may not have a normal bowel movement for a few days.  Please Note:  You might notice some irritation and congestion in your nose or some drainage.  This is from the oxygen used during your procedure.  There is no need for concern and it should clear up in a day or so.  SYMPTOMS TO REPORT IMMEDIATELY:  Following lower endoscopy (colonoscopy or flexible sigmoidoscopy):  Excessive amounts of blood in the stool  Significant tenderness or worsening of abdominal pains  Swelling of the abdomen that is new, acute  Fever of 100F or higher   For urgent or emergent issues, a gastroenterologist can be reached at any hour by calling [phone]. Do not use MyChart messaging for urgent concerns.    DIET:  We do recommend a small meal at first, but then you may proceed to your regular diet.  Drink plenty of fluids but you should avoid alcoholic beverages for 24 hours.  ACTIVITY:  You should plan to take it easy for the rest of today and you should NOT DRIVE or use heavy  machinery until tomorrow (because of the sedation medicines used during the test).    FOLLOW UP: Our staff will call the number listed on your records 48-72 hours following your procedure to check on you and address any questions or concerns that you may have regarding the information given to you following your procedure. If we do not reach you, we will leave a message.  We will attempt to reach you two times.  During this call, we will ask if you have developed any symptoms of COVID 19. If you develop any symptoms (ie: fever, flu-like symptoms, shortness of breath, cough etc.) before then, please call [phone].  If you test positive for Covid 19 in the 2 weeks post procedure, please call and report this information to [place].    If any biopsies were taken you will be contacted by phone or by letter within the next 1-3 weeks.  Please call us at [phone] if you have not heard about the biopsies in 3 weeks.    SIGNATURES/CONFIDENTIALITY: You and/or your care partner have signed paperwork which will be entered into your electronic medical record.  These signatures attest to the fact that that the information above on your After Visit Summary has been reviewed and is understood.  Full responsibility of the confidentiality of this discharge information lies with you and/or your care-partner.

## 2021-11-21 NOTE — Op Note (Signed)
[facility] Patient Name: Collin Miller Procedure Date: 11/21/2021 3:26 PM MRN: [id number] Endoscopist: Nicki Reaper E. Candis Miller , MD Age: 50 Referring MD:  Date of Birth: Jan 01, 1971 Gender: Male Account #: [id number] Procedure:                Colonoscopy Indications:              Positive fecal immunochemical test Medicines:                Monitored Anesthesia Care Procedure:                Pre-Anesthesia Assessment:                           - Prior to the procedure, a History and Physical                            was performed, and patient medications and                            allergies were reviewed. The patient's tolerance of                            previous anesthesia was also reviewed. The risks                            and benefits of the procedure and the sedation                            options and risks were discussed with the patient.                            All questions were answered, and informed consent                            was obtained. Prior Anticoagulants: The patient has                            taken no previous anticoagulant or antiplatelet                            agents. ASA Grade Assessment: II - A patient with                            mild systemic disease. After reviewing the risks                            and benefits, the patient was deemed in                            satisfactory condition to undergo the procedure.                           After obtaining informed consent, the colonoscope  was passed under direct vision. Throughout the                            procedure, the patient's blood pressure, pulse, and                            oxygen saturations were monitored continuously. The                            Olympus CF-HQ190L (Serial# [id number]) Colonoscope was                            introduced through the anus and advanced to the the                            terminal ileum, with  identification of the                            appendiceal orifice and IC valve. The colonoscopy                            was performed without difficulty. The patient                            tolerated the procedure well. The quality of the                            bowel preparation was adequate. The terminal ileum,                            ileocecal valve, appendiceal orifice, and rectum                            were photographed. Scope In: 3:38:39 PM Scope Out: 4:26:19 PM Scope Withdrawal Time: 0 hours 44 minutes 12 seconds  Total Procedure Duration: 0 hours 47 minutes 40 seconds  Findings:                 The perianal and digital rectal examinations were                            normal. Pertinent negatives include normal                            sphincter tone and no palpable rectal lesions.                           A large friable 35 mm polyp was found in the cecum.                            The polyp was pedunculated and seemed to be arising                            from the appendiceal orifice. Prior to polypectomy  the stalk was successfully injected with 4 mL of a                            1:10,000 solution to reduce bleeding during                            polypectomy. The polyp was removed with a piecemeal                            technique using a hot snare. Resection and                            retrieval were complete using a combination of Roth                            net retrieval and suction. Biopsies of the                            polypectomy site and surrounding mucosa were taken                            with a cold forceps for histology. Estimated blood                            loss was minimal.                           A 3 mm polyp was found in the ascending colon. The                            polyp was sessile. The polyp was removed with a                            cold snare. Resection and retrieval  were complete.                            Estimated blood loss was minimal.                           The exam was otherwise normal throughout the                            examined colon.                           The terminal ileum appeared normal.                           The retroflexed view of the distal rectum and anal                            verge was normal and showed no anal or rectal  abnormalities. Complications:            No immediate complications. Estimated Blood Loss:     Estimated blood loss was minimal. Impression:               - One 35 mm polyp in the cecum, removed piecemeal                            using a hot snare. Resected and retrieved.                            Injected. Biopsied.                           - One 3 mm polyp in the ascending colon, removed                            with a cold snare. Resected and retrieved.                           - The examined portion of the ileum was normal.                           - The distal rectum and anal verge are normal on                            retroflexion view. Recommendation:           - Patient has a contact number available for                            emergencies. The signs and symptoms of potential                            delayed complications were discussed with the                            patient. Return to normal activities tomorrow.                            Written discharge instructions were provided to the                            patient.                           - Resume previous diet.                           - Continue present medications.                           - Await pathology results. Further recommendations                            will be based on pathology results. Collin Merrick E. Candis Schatz, MD 11/21/2021 4:35:18 PM This  report has been signed electronically.

## 2021-11-21 NOTE — Progress Notes (Signed)
VS completed by CW.   Pt's states no medical or surgical changes since previsit or office visit.  

## 2021-11-21 NOTE — Progress Notes (Signed)
Called to room to assist during endoscopic procedure.  Patient ID and intended procedure confirmed with present staff. Received instructions for my participation in the procedure from the performing physician.  

## 2021-11-21 NOTE — Progress Notes (Signed)
PT taken to PACU. Monitors in place. VSS. Report given to RN. 

## 2021-11-21 NOTE — Progress Notes (Signed)
[facility] History and Physical   Primary Care Physician:  Rogers Blocker, MD   Reason for Procedure:   Positive Fecal occult blood test  Plan:    Colonoscopy to evaluate positive FOBT     HPI: Collin Miller is a 50 y.o. male undergoing colonoscopy because of positive FOBT.  He has no family history of colon cancer and no chronic GI symptoms.    Past Medical History:  Diagnosis Date   Hyperlipidemia    Limb pain    Tobacco abuse    Vitamin D deficiency     Past Surgical History:  Procedure Laterality Date   NO PAST SURGERIES      Prior to Admission medications   Medication Sig Start Date End Date Taking? Authorizing Provider  atorvastatin (LIPITOR) 10 MG tablet Take 10 mg by mouth daily.   Yes [provider]  Cholecalciferol (VITAMIN D3) 10 MCG (400 UNIT) CAPS Take 1 tablet by mouth daily.   Yes [provider]  acetaminophen (TYLENOL) 325 MG tablet Take 325-650 mg by mouth as needed.    [provider]  cetirizine (ZYRTEC) 10 MG tablet Take 10 mg by mouth daily.    [provider]  ibuprofen (ADVIL,MOTRIN) 200 MG tablet Take 200 mg by mouth as needed for pain (pain).    [provider]    Current Outpatient Medications  Medication Sig Dispense Refill   atorvastatin (LIPITOR) 10 MG tablet Take 10 mg by mouth daily.     Cholecalciferol (VITAMIN D3) 10 MCG (400 UNIT) CAPS Take 1 tablet by mouth daily.     acetaminophen (TYLENOL) 325 MG tablet Take 325-650 mg by mouth as needed.     cetirizine (ZYRTEC) 10 MG tablet Take 10 mg by mouth daily.     ibuprofen (ADVIL,MOTRIN) 200 MG tablet Take 200 mg by mouth as needed for pain (pain).     Current Facility-Administered Medications  Medication Dose Route Frequency Provider Last Rate Last Admin   0.9 %  sodium chloride infusion  500 mL Intravenous Once Daryel November, MD        Allergies as of 11/21/2021 - Review Complete 11/21/2021  Allergen Reaction Noted    Oxycodone Other (See Comments) 10/19/2021    Family History  Problem Relation Age of Onset   Hyperlipidemia Mother    Prostate cancer Maternal Uncle    Diabetes Maternal Uncle        x 3   Colon cancer Neg Hx    Stomach cancer Neg Hx    Rectal cancer Neg Hx     Social History   Socioeconomic History   Marital status: Single    Spouse name: Not on file   Number of children: 0   Years of education: Not on file   Highest education level: Not on file  Occupational History   Occupation: [employer]    Comment: Armed forces technical officer  Tobacco Use   Smoking status: Every Day    Packs/day: 0.50    Years: 20.00    Pack years: 10.00    Types: Cigarettes   Smokeless tobacco: Never  Vaping Use   Vaping Use: Never used  Substance and Sexual Activity   Alcohol use: Yes    Comment: occasional   Drug use: No   Sexual activity: Not on file  Other Topics Concern   Not on file  Social History Narrative   Not on file   Social Determinants of Health   Financial  Resource Strain: Not on file  Food Insecurity: Not on file  Transportation Needs: Not on file  Physical Activity: Not on file  Stress: Not on file  Social Connections: Not on file  Intimate Partner Violence: Not on file    Review of Systems:  All other review of systems negative except as mentioned in the HPI.  Physical Exam: Vital signs BP (!) 141/92   Pulse 92   Temp (!) 97.5 F (36.4 C) (Temporal)   Ht 5\' 11"  (1.803 m)   Wt 176 lb (79.8 kg)   SpO2 100%   BMI 24.55 kg/m   General:   Alert,  Well-developed, well-nourished, pleasant and cooperative in NAD Airway:  Mallampati 3 Lungs:  Clear throughout to auscultation.   Heart:  Regular rate and rhythm; no murmurs, clicks, rubs,  or gallops. Abdomen:  Soft, nontender and nondistended. Normal bowel sounds.   Neuro/Psych:  Normal mood and affect. A and O x 3   Bhavika Schnider E. Candis Schatz, MD [facility]

## 2021-11-23 ENCOUNTER — Telehealth: Payer: Self-pay

## 2021-11-23 ENCOUNTER — Telehealth: Payer: Self-pay | Admitting: *Deleted

## 2021-11-23 NOTE — Telephone Encounter (Signed)
°  Follow up Call-  Call back number 11/21/2021  Post procedure Call Back phone  # [phone]  Permission to leave phone message Yes  Some recent data might be hidden     Patient questions:  Do you have a fever, pain , or abdominal swelling? No. Pain Score  0 *  Have you tolerated food without any problems? Yes.    Have you been able to return to your normal activities? Yes.    Do you have any questions about your discharge instructions: Diet   No. Medications  No. Follow up visit  No.  Do you have questions or concerns about your Care? No.  Actions: * If pain score is 4 or above: No action needed, pain <4.

## 2021-11-23 NOTE — Telephone Encounter (Signed)
Unable to leave message mailbox full.

## 2021-11-24 ENCOUNTER — Encounter: Payer: Self-pay | Admitting: Gastroenterology

## 2021-12-17 ENCOUNTER — Observation Stay (HOSPITAL_COMMUNITY)
Admission: EM | Admit: 2021-12-17 | Discharge: 2021-12-19 | Disposition: A | Discharge status: Home / Self Care | Payer: Medicare Other | Attending: General Surgery | Admitting: General Surgery

## 2021-12-17 ENCOUNTER — Encounter (HOSPITAL_COMMUNITY): Payer: Self-pay

## 2021-12-17 ENCOUNTER — Other Ambulatory Visit: Payer: Self-pay

## 2021-12-17 DIAGNOSIS — K3580 Unspecified acute appendicitis: Secondary | ICD-10-CM | POA: Diagnosis not present

## 2021-12-17 DIAGNOSIS — Z20822 Contact with and (suspected) exposure to covid-19: Secondary | ICD-10-CM | POA: Diagnosis not present

## 2021-12-17 DIAGNOSIS — E785 Hyperlipidemia, unspecified: Secondary | ICD-10-CM | POA: Diagnosis not present

## 2021-12-17 DIAGNOSIS — K353 Acute appendicitis with localized peritonitis, without perforation or gangrene: Secondary | ICD-10-CM

## 2021-12-17 DIAGNOSIS — R1031 Right lower quadrant pain: Secondary | ICD-10-CM | POA: Diagnosis present

## 2021-12-17 LAB — COMPREHENSIVE METABOLIC PANEL
ALT: 29 U/L (ref 0–44)
AST: 19 U/L (ref 15–41)
Albumin: 4.3 g/dL (ref 3.5–5.0)
Alkaline Phosphatase: 64 U/L (ref 38–126)
Anion gap: 10 (ref 5–15)
BUN: 14 mg/dL (ref 6–20)
CO2: 25 mmol/L (ref 22–32)
Calcium: 9.5 mg/dL (ref 8.9–10.3)
Chloride: 103 mmol/L (ref 98–111)
Creatinine, Ser: 1.21 mg/dL (ref 0.61–1.24)
GFR, Estimated: >60 mL/min (ref >60)
Glucose, Bld: 156 mg/dL — ABNORMAL HIGH (ref 70–99)
Potassium: 3.8 mmol/L (ref 3.5–5.1)
Sodium: 138 mmol/L (ref 135–145)
Total Bilirubin: 0.9 mg/dL (ref 0.3–1.2)
Total Protein: 7.5 g/dL (ref 6.5–8.1)

## 2021-12-17 LAB — CBC WITH DIFFERENTIAL/PLATELET
Abs Immature Granulocytes: 0.03 10*3/uL (ref 0.00–0.07)
Basophils Absolute: 0 10*3/uL (ref 0.0–0.1)
Basophils Relative: 0 %
Eosinophils Absolute: 0 10*3/uL (ref 0.0–0.5)
Eosinophils Relative: 0 %
HCT: 51.3 % (ref 39.0–52.0)
Hemoglobin: 17.1 g/dL — ABNORMAL HIGH (ref 13.0–17.0)
Immature Granulocytes: 0 %
Lymphocytes Relative: 11 %
Lymphs Abs: 1.5 10*3/uL (ref 0.7–4.0)
MCH: 30.6 pg (ref 26.0–34.0)
MCHC: 33.3 g/dL (ref 30.0–36.0)
MCV: 91.8 fL (ref 80.0–100.0)
Monocytes Absolute: 0.9 10*3/uL (ref 0.1–1.0)
Monocytes Relative: 7 %
Neutro Abs: 11 10*3/uL — ABNORMAL HIGH (ref 1.7–7.7)
Neutrophils Relative %: 82 %
Platelets: 277 10*3/uL (ref 150–400)
RBC: 5.59 MIL/uL (ref 4.22–5.81)
RDW: 12.5 % (ref 11.5–15.5)
WBC: 13.5 10*3/uL — ABNORMAL HIGH (ref 4.0–10.5)
nRBC: 0 % (ref 0.0–0.2)

## 2021-12-17 LAB — LIPASE, BLOOD: Lipase: 32 U/L (ref 11–51)

## 2021-12-17 MED ORDER — FENTANYL CITRATE PF 50 MCG/ML IJ SOSY
50.0000 ug | PREFILLED_SYRINGE | Freq: Once | INTRAMUSCULAR | Status: AC
  Administered 2021-12-17: 50 ug via INTRAVENOUS
  Filled 2021-12-17: qty 1

## 2021-12-17 MED ORDER — SODIUM CHLORIDE 0.9 % IV BOLUS
1000.0000 mL | Freq: Once | INTRAVENOUS | Status: AC
  Administered 2021-12-17: 1000 mL via INTRAVENOUS

## 2021-12-17 MED ORDER — ONDANSETRON HCL 4 MG/2ML IJ SOLN
4.0000 mg | Freq: Once | INTRAMUSCULAR | Status: AC
  Administered 2021-12-17: 4 mg via INTRAVENOUS
  Filled 2021-12-17: qty 2

## 2021-12-17 NOTE — ED Triage Notes (Signed)
Pt BIB EMS with reports of right lower abdominal pain x 3 hrs.

## 2021-12-17 NOTE — ED Notes (Signed)
Patient rates pain 10/10.

## 2021-12-17 NOTE — ED Provider Notes (Signed)
[facility] DEPT Provider Note   CSN: [id number] Arrival date & time: 12/17/21  2117     History  Chief Complaint  Patient presents with   Abdominal Pain    Collin Miller is a 51 y.o. male.  The history is provided by the patient and medical records.  Abdominal Pain Collin Miller is a 51 y.o. male who presents to the Emergency Department complaining of abdominal pain.  He presents emergency department for evaluation of abrupt onset right lower quadrant abdominal pain that began at 630 this evening.  Pain is constant in nature, nonradiating.  No prior similar symptoms.  No associated nausea, vomiting, diarrhea, dysuria.  He has a history of hyperlipidemia and uses tobacco.  No alcohol or drug use.  No prior abdominal surgeries.  No prior similar symptoms.    Home Medications Prior to Admission medications   Medication Sig Start Date End Date Taking? Authorizing Provider  acetaminophen (TYLENOL) 325 MG tablet Take 325-650 mg by mouth as needed.    [provider]  atorvastatin (LIPITOR) 10 MG tablet Take 10 mg by mouth daily.    [provider]  cetirizine (ZYRTEC) 10 MG tablet Take 10 mg by mouth daily.    [provider]  Cholecalciferol (VITAMIN D3) 10 MCG (400 UNIT) CAPS Take 1 tablet by mouth daily.    [provider]  ibuprofen (ADVIL,MOTRIN) 200 MG tablet Take 200 mg by mouth as needed for pain (pain).    [provider]      Allergies    Oxycodone    Review of Systems   Review of Systems  Gastrointestinal:  Positive for abdominal pain.  All other systems reviewed and are negative.  Physical Exam Updated Vital Signs BP 106/77    Pulse (!) 114    Temp (!) 97 F (36.1 C) (Axillary)    Resp 12    SpO2 100%  Physical Exam Vitals and nursing note reviewed.  Constitutional:      Appearance: He is well-developed.  HENT:     Head: Normocephalic and atraumatic.  Cardiovascular:     Rate and Rhythm:  Regular rhythm. Tachycardia present.     Heart sounds: No murmur heard. Pulmonary:     Effort: Pulmonary effort is normal. No respiratory distress.     Breath sounds: Normal breath sounds.  Abdominal:     Palpations: Abdomen is soft.     Tenderness: There is no guarding or rebound.     Comments: Generalized abdominal tenderness with focal peritonitis guarding/rebound in the RLQ  Musculoskeletal:        General: No tenderness.  Skin:    General: Skin is warm and dry.  Neurological:     Mental Status: He is alert and oriented to person, place, and time.  Psychiatric:        Behavior: Behavior normal.    ED Results / Procedures / Treatments   Labs (all labs ordered are listed, but only abnormal results are displayed) Labs Reviewed  CBC WITH DIFFERENTIAL/PLATELET - Abnormal; Notable for the following components:      Result Value   WBC 13.5 (*)    Hemoglobin 17.1 (*)    Neutro Abs 11.0 (*)    All other components within normal limits  COMPREHENSIVE METABOLIC PANEL - Abnormal; Notable for the following components:   Glucose, Bld 156 (*)    All other components within normal limits  LIPASE, BLOOD  URINALYSIS, ROUTINE W REFLEX MICROSCOPIC  EKG None  Radiology No results found.  Procedures Procedures   CRITICAL CARE Performed by: Quintella Reichert   Total critical care time: 35 minutes  Critical care time was exclusive of separately billable procedures and treating other patients.  Critical care was necessary to treat or prevent imminent or life-threatening deterioration.  Critical care was time spent personally by me on the following activities: development of treatment plan with patient and/or surrogate as well as nursing, discussions with consultants, evaluation of patient's response to treatment, examination of patient, obtaining history from patient or surrogate, ordering and performing treatments and interventions, ordering and review of laboratory studies,  ordering and review of radiographic studies, pulse oximetry and re-evaluation of patient's condition.  Medications Ordered in ED Medications  sodium chloride 0.9 % bolus 1,000 mL (has no administration in time range)  fentaNYL (SUBLIMAZE) injection 50 mcg (has no administration in time range)  ondansetron (ZOFRAN) injection 4 mg (has no administration in time range)    ED Course/ Medical Decision Making/ A&P                           Medical Decision Making  Patient here for evaluation of abdominal pain that started earlier this evening.  He does have focal tenderness on examination, mild tachycardia and leukocytosis.  CT abdomen pelvis was obtained, which demonstrates acute appendicitis.  He was treated with broad-spectrum antibiotics given his severe pain and leukocytosis and tachycardia.  He was also treated with IV fluid boluses for tachycardia.  EKG reviewed, sinus rhythm.  Discussed with patient findings of studies recommendation for admission and he is in agreement with treatment plan.  Discussed with Dr. Harlow Asa with general surgery, he plans to evaluate the patient in the emergency department and admit for ongoing treatment.        Final Clinical Impression(s) / ED Diagnoses Final diagnoses:  None    Rx / DC Orders ED Discharge Orders     None         Quintella Reichert, MD 12/18/21 [phone]

## 2021-12-18 ENCOUNTER — Emergency Department (HOSPITAL_COMMUNITY): Payer: Medicare Other

## 2021-12-18 ENCOUNTER — Encounter (HOSPITAL_COMMUNITY): Payer: Self-pay

## 2021-12-18 ENCOUNTER — Observation Stay (HOSPITAL_COMMUNITY): Payer: Medicare Other | Admitting: Certified Registered Nurse Anesthetist

## 2021-12-18 ENCOUNTER — Encounter (HOSPITAL_COMMUNITY)
Admission: EM | Disposition: A | Discharge status: Home / Self Care | Payer: Self-pay | Source: Home / Self Care | Attending: Emergency Medicine

## 2021-12-18 DIAGNOSIS — K3580 Unspecified acute appendicitis: Secondary | ICD-10-CM | POA: Diagnosis present

## 2021-12-18 DIAGNOSIS — E785 Hyperlipidemia, unspecified: Secondary | ICD-10-CM | POA: Diagnosis not present

## 2021-12-18 DIAGNOSIS — Z20822 Contact with and (suspected) exposure to covid-19: Secondary | ICD-10-CM | POA: Diagnosis not present

## 2021-12-18 HISTORY — PX: LAPAROSCOPIC APPENDECTOMY: SHX408

## 2021-12-18 LAB — RESP PANEL BY RT-PCR (FLU A&B, COVID) ARPGX2
Influenza A by PCR: NEGATIVE
Influenza B by PCR: NEGATIVE
SARS Coronavirus 2 by RT PCR: NEGATIVE

## 2021-12-18 LAB — HIV ANTIBODY (ROUTINE TESTING W REFLEX): HIV Screen 4th Generation wRfx: NONREACTIVE

## 2021-12-18 SURGERY — APPENDECTOMY, LAPAROSCOPIC
Anesthesia: General | Site: Abdomen

## 2021-12-18 MED ORDER — FENTANYL CITRATE PF 50 MCG/ML IJ SOSY
PREFILLED_SYRINGE | INTRAMUSCULAR | Status: AC
  Filled 2021-12-18: qty 3

## 2021-12-18 MED ORDER — PROPOFOL 10 MG/ML IV BOLUS
INTRAVENOUS | Status: AC
  Filled 2021-12-18: qty 20

## 2021-12-18 MED ORDER — FENTANYL CITRATE (PF) 250 MCG/5ML IJ SOLN
INTRAMUSCULAR | Status: DC | PRN
  Administered 2021-12-18: 25 ug via INTRAVENOUS
  Administered 2021-12-18: 50 ug via INTRAVENOUS
  Administered 2021-12-18: 25 ug via INTRAVENOUS

## 2021-12-18 MED ORDER — PROPOFOL 10 MG/ML IV BOLUS
INTRAVENOUS | Status: DC | PRN
  Administered 2021-12-18: 200 mg via INTRAVENOUS

## 2021-12-18 MED ORDER — PHENYLEPHRINE 40 MCG/ML (10ML) SYRINGE FOR IV PUSH (FOR BLOOD PRESSURE SUPPORT)
PREFILLED_SYRINGE | INTRAVENOUS | Status: DC | PRN
  Administered 2021-12-18 (×3): 120 ug via INTRAVENOUS

## 2021-12-18 MED ORDER — ROCURONIUM BROMIDE 10 MG/ML (PF) SYRINGE
PREFILLED_SYRINGE | INTRAVENOUS | Status: AC
  Filled 2021-12-18: qty 10

## 2021-12-18 MED ORDER — ACETAMINOPHEN 10 MG/ML IV SOLN
INTRAVENOUS | Status: AC
  Filled 2021-12-18: qty 100

## 2021-12-18 MED ORDER — ONDANSETRON HCL 4 MG/2ML IJ SOLN
INTRAMUSCULAR | Status: DC | PRN
  Administered 2021-12-18: 4 mg via INTRAVENOUS

## 2021-12-18 MED ORDER — IBUPROFEN 400 MG PO TABS: 600.0000 mg | ORAL_TABLET | Freq: Four times a day (QID) | ORAL | Status: DC | PRN

## 2021-12-18 MED ORDER — LACTATED RINGERS IV BOLUS
1000.0000 mL | Freq: Once | INTRAVENOUS | Status: AC
  Administered 2021-12-18: 1000 mL via INTRAVENOUS

## 2021-12-18 MED ORDER — POTASSIUM CHLORIDE IN NACL 20-0.45 MEQ/L-% IV SOLN
INTRAVENOUS | Status: DC
  Filled 2021-12-18 (×3): qty 1000

## 2021-12-18 MED ORDER — ACETAMINOPHEN 10 MG/ML IV SOLN
1000.0000 mg | Freq: Once | INTRAVENOUS | Status: AC
  Administered 2021-12-18: 1000 mg via INTRAVENOUS

## 2021-12-18 MED ORDER — BUPIVACAINE-EPINEPHRINE (PF) 0.25% -1:200000 IJ SOLN
INTRAMUSCULAR | Status: AC
  Filled 2021-12-18: qty 30

## 2021-12-18 MED ORDER — MIDAZOLAM HCL 2 MG/2ML IJ SOLN
INTRAMUSCULAR | Status: DC | PRN
  Administered 2021-12-18: 2 mg via INTRAVENOUS

## 2021-12-18 MED ORDER — PIPERACILLIN-TAZOBACTAM 3.375 G IVPB
3.3750 g | Freq: Three times a day (TID) | INTRAVENOUS | Status: DC
  Administered 2021-12-18 – 2021-12-19 (×4): 3.375 g via INTRAVENOUS
  Filled 2021-12-18 (×5): qty 50

## 2021-12-18 MED ORDER — AMISULPRIDE (ANTIEMETIC) 5 MG/2ML IV SOLN: 10.0000 mg | Freq: Once | INTRAVENOUS | Status: DC | PRN

## 2021-12-18 MED ORDER — ROCURONIUM BROMIDE 10 MG/ML (PF) SYRINGE
PREFILLED_SYRINGE | INTRAVENOUS | Status: DC | PRN
Start: 2021-12-18 — End: 2021-12-18
  Administered 2021-12-18: 40 mg via INTRAVENOUS

## 2021-12-18 MED ORDER — PHENYLEPHRINE HCL (PRESSORS) 10 MG/ML IV SOLN
INTRAVENOUS | Status: AC
  Filled 2021-12-18: qty 1

## 2021-12-18 MED ORDER — TRAMADOL HCL 50 MG PO TABS: 50.0000 mg | ORAL_TABLET | Freq: Four times a day (QID) | ORAL | Status: DC | PRN

## 2021-12-18 MED ORDER — IOHEXOL 350 MG/ML SOLN
80.0000 mL | Freq: Once | INTRAVENOUS | Status: AC | PRN
  Administered 2021-12-18: 80 mL via INTRAVENOUS

## 2021-12-18 MED ORDER — FENTANYL CITRATE (PF) 250 MCG/5ML IJ SOLN
INTRAMUSCULAR | Status: AC
  Filled 2021-12-18: qty 5

## 2021-12-18 MED ORDER — SUCCINYLCHOLINE CHLORIDE 200 MG/10ML IV SOSY
PREFILLED_SYRINGE | INTRAVENOUS | Status: AC
  Filled 2021-12-18: qty 10

## 2021-12-18 MED ORDER — DEXAMETHASONE SODIUM PHOSPHATE 10 MG/ML IJ SOLN
INTRAMUSCULAR | Status: AC
  Filled 2021-12-18: qty 1

## 2021-12-18 MED ORDER — ONDANSETRON HCL 4 MG/2ML IJ SOLN: 4.0000 mg | Freq: Four times a day (QID) | INTRAMUSCULAR | Status: DC | PRN

## 2021-12-18 MED ORDER — ONDANSETRON 4 MG PO TBDP: 4.0000 mg | ORAL_TABLET | Freq: Four times a day (QID) | ORAL | Status: DC | PRN

## 2021-12-18 MED ORDER — FENTANYL CITRATE PF 50 MCG/ML IJ SOSY
25.0000 ug | PREFILLED_SYRINGE | INTRAMUSCULAR | Status: DC | PRN
  Administered 2021-12-18: 50 ug via INTRAVENOUS

## 2021-12-18 MED ORDER — PROMETHAZINE HCL 25 MG/ML IJ SOLN: 6.2500 mg | INTRAMUSCULAR | Status: DC | PRN

## 2021-12-18 MED ORDER — LIDOCAINE HCL (CARDIAC) PF 100 MG/5ML IV SOSY
PREFILLED_SYRINGE | INTRAVENOUS | Status: DC | PRN
Start: 2021-12-18 — End: 2021-12-18
  Administered 2021-12-18: 80 mg via INTRAVENOUS

## 2021-12-18 MED ORDER — FENTANYL CITRATE PF 50 MCG/ML IJ SOSY
50.0000 ug | PREFILLED_SYRINGE | Freq: Once | INTRAMUSCULAR | Status: AC
  Administered 2021-12-18: 50 ug via INTRAVENOUS
  Filled 2021-12-18: qty 1

## 2021-12-18 MED ORDER — ACETAMINOPHEN 650 MG RE SUPP
650.0000 mg | Freq: Once | RECTAL | Status: AC
  Administered 2021-12-18: 650 mg via RECTAL
  Filled 2021-12-18: qty 1

## 2021-12-18 MED ORDER — ONDANSETRON HCL 4 MG/2ML IJ SOLN
INTRAMUSCULAR | Status: AC
  Filled 2021-12-18: qty 2

## 2021-12-18 MED ORDER — ACETAMINOPHEN 325 MG PO TABS
650.0000 mg | ORAL_TABLET | Freq: Four times a day (QID) | ORAL | Status: DC | PRN
  Administered 2021-12-18 – 2021-12-19 (×2): 650 mg via ORAL
  Filled 2021-12-18 (×2): qty 2

## 2021-12-18 MED ORDER — DEXAMETHASONE SODIUM PHOSPHATE 10 MG/ML IJ SOLN
INTRAMUSCULAR | Status: DC | PRN
  Administered 2021-12-18: 5 mg via INTRAVENOUS

## 2021-12-18 MED ORDER — SUCCINYLCHOLINE CHLORIDE 200 MG/10ML IV SOSY
PREFILLED_SYRINGE | INTRAVENOUS | Status: DC | PRN
Start: 2021-12-18 — End: 2021-12-18
  Administered 2021-12-18: 160 mg via INTRAVENOUS

## 2021-12-18 MED ORDER — SUGAMMADEX SODIUM 200 MG/2ML IV SOLN
INTRAVENOUS | Status: DC | PRN
  Administered 2021-12-18: 200 mg via INTRAVENOUS

## 2021-12-18 MED ORDER — PHENYLEPHRINE HCL-NACL 20-0.9 MG/250ML-% IV SOLN
INTRAVENOUS | Status: DC | PRN
Start: 2021-12-18 — End: 2021-12-18
  Administered 2021-12-18: 50 ug/min via INTRAVENOUS

## 2021-12-18 MED ORDER — SODIUM CHLORIDE 0.9 % IV SOLN: INTRAVENOUS | Status: DC | PRN

## 2021-12-18 MED ORDER — 0.9 % SODIUM CHLORIDE (POUR BTL) OPTIME
TOPICAL | Status: DC | PRN
  Administered 2021-12-18: 1000 mL

## 2021-12-18 MED ORDER — BUPIVACAINE-EPINEPHRINE 0.25% -1:200000 IJ SOLN
INTRAMUSCULAR | Status: DC | PRN
  Administered 2021-12-18: 20 mL

## 2021-12-18 MED ORDER — LACTATED RINGERS IR SOLN
Status: DC | PRN
Start: 2021-12-18 — End: 2021-12-18
  Administered 2021-12-18: 1000 mL

## 2021-12-18 MED ORDER — PHENYLEPHRINE 40 MCG/ML (10ML) SYRINGE FOR IV PUSH (FOR BLOOD PRESSURE SUPPORT)
PREFILLED_SYRINGE | INTRAVENOUS | Status: AC
  Filled 2021-12-18: qty 20

## 2021-12-18 MED ORDER — LIDOCAINE 2% (20 MG/ML) 5 ML SYRINGE
INTRAMUSCULAR | Status: AC
  Filled 2021-12-18: qty 5

## 2021-12-18 MED ORDER — ACETAMINOPHEN 650 MG RE SUPP: 650.0000 mg | Freq: Four times a day (QID) | RECTAL | Status: DC | PRN

## 2021-12-18 MED ORDER — MIDAZOLAM HCL 2 MG/2ML IJ SOLN
INTRAMUSCULAR | Status: AC
  Filled 2021-12-18: qty 2

## 2021-12-18 MED ORDER — HYDROMORPHONE HCL 1 MG/ML IJ SOLN: 1.0000 mg | INTRAMUSCULAR | Status: DC | PRN

## 2021-12-18 MED ORDER — LACTATED RINGERS IV SOLN: INTRAVENOUS | Status: DC | PRN

## 2021-12-18 MED ORDER — PIPERACILLIN-TAZOBACTAM 3.375 G IVPB 30 MIN
3.3750 g | Freq: Once | INTRAVENOUS | Status: AC
  Administered 2021-12-18: 3.375 g via INTRAVENOUS
  Filled 2021-12-18: qty 50

## 2021-12-18 SURGICAL SUPPLY — 40 items
APPLIER CLIP ROT 10 11.4 M/L (STAPLE)
BAG COUNTER SPONGE SURGICOUNT (BAG) IMPLANT
CHLORAPREP W/TINT 26 (MISCELLANEOUS) ×2 IMPLANT
CLIP APPLIE ROT 10 11.4 M/L (STAPLE) IMPLANT
COVER SURGICAL LIGHT HANDLE (MISCELLANEOUS) ×2 IMPLANT
CUTTER FLEX LINEAR 45M (STAPLE) IMPLANT
DECANTER SPIKE VIAL GLASS SM (MISCELLANEOUS) ×2 IMPLANT
DERMABOND ADVANCED (GAUZE/BANDAGES/DRESSINGS) ×1
DERMABOND ADVANCED .7 DNX12 (GAUZE/BANDAGES/DRESSINGS) ×1 IMPLANT
DRAPE LAPAROSCOPIC ABDOMINAL (DRAPES) ×2 IMPLANT
ELECT PENCIL ROCKER SW 15FT (MISCELLANEOUS) IMPLANT
ELECT REM PT RETURN 15FT ADLT (MISCELLANEOUS) ×2 IMPLANT
ENDOLOOP SUT PDS II  0 18 (SUTURE)
ENDOLOOP SUT PDS II 0 18 (SUTURE) IMPLANT
GLOVE SURG ORTHO LTX SZ8 (GLOVE) ×2 IMPLANT
GLOVE SURG SYN 7.5  E (GLOVE) ×2
GLOVE SURG SYN 7.5 E (GLOVE) ×1 IMPLANT
GLOVE SURG SYN 7.5 PF PI (GLOVE) ×1 IMPLANT
GOWN STRL REUS W/TWL XL LVL3 (GOWN DISPOSABLE) ×4 IMPLANT
IRRIG SUCT STRYKERFLOW 2 WTIP (MISCELLANEOUS) ×2
IRRIGATION SUCT STRKRFLW 2 WTP (MISCELLANEOUS) ×1 IMPLANT
KIT BASIN OR (CUSTOM PROCEDURE TRAY) ×2 IMPLANT
KIT TURNOVER KIT A (KITS) IMPLANT
POUCH SPECIMEN RETRIEVAL 10MM (ENDOMECHANICALS) ×2 IMPLANT
RELOAD 45 VASCULAR/THIN (ENDOMECHANICALS) IMPLANT
RELOAD STAPLE 45 2.5 WHT GRN (ENDOMECHANICALS) IMPLANT
RELOAD STAPLE 45 3.5 BLU ETS (ENDOMECHANICALS) IMPLANT
RELOAD STAPLE TA45 3.5 REG BLU (ENDOMECHANICALS) ×4 IMPLANT
SET TUBE SMOKE EVAC HIGH FLOW (TUBING) ×2 IMPLANT
SHEARS HARMONIC ACE PLUS 36CM (ENDOMECHANICALS) ×2 IMPLANT
STRIP CLOSURE SKIN 1/2X4 (GAUZE/BANDAGES/DRESSINGS) ×2 IMPLANT
SUT MNCRL AB 4-0 PS2 18 (SUTURE) ×2 IMPLANT
TOWEL OR 17X26 10 PK STRL BLUE (TOWEL DISPOSABLE) ×2 IMPLANT
TOWEL OR NON WOVEN STRL DISP B (DISPOSABLE) ×2 IMPLANT
TRAY FOLEY MTR SLVR 14FR STAT (SET/KITS/TRAYS/PACK) IMPLANT
TRAY FOLEY MTR SLVR 16FR STAT (SET/KITS/TRAYS/PACK) IMPLANT
TRAY LAPAROSCOPIC (CUSTOM PROCEDURE TRAY) ×2 IMPLANT
TROCAR BLADELESS OPT 5 100 (ENDOMECHANICALS) ×2 IMPLANT
TROCAR XCEL BLUNT TIP 100MML (ENDOMECHANICALS) ×2 IMPLANT
TROCAR XCEL NON-BLD 11X100MML (ENDOMECHANICALS) ×2 IMPLANT

## 2021-12-18 NOTE — Op Note (Signed)
OPERATIVE REPORT - LAPAROSCOPIC APPENDECTOMY  Preop diagnosis:  Acute appendicitis  Postop diagnosis:  same, peritonitis  Procedure:  Laparoscopic appendectomy  Surgeon:  Armandina Gemma, MD  Anesthesia:  general endotracheal  Estimated blood loss:  minimal  Preparation:  Chlora-prep  Complications:  none  Indications:  Patient is a 51 year old male who presents to the emergency department with 24-hour history of abdominal pain localized to the right lower quadrant.  He denies nausea or emesis.  He denies fevers or chills.  Laboratory studies showed an elevated white blood cell count of 13.5.  CT scan of the abdomen and pelvis demonstrated findings consistent with acute appendicitis.  Patient has had no prior abdominal surgery.  Physical examination is consistent with acute appendicitis.  Patient works at the [employer] in [place].  Procedure:  Patient was brought to the operating room and placed in a supine position on the operating room table. Following administration of general anesthesia, a time out was held and the patient's name and procedure was confirmed. Patient was then prepped and draped in the usual strict aseptic fashion.  After ascertaining that an adequate level of anesthesia had been achieved, a peri-umbilical incision was made with a #15 blade. Dissection was carried down to the fascia. Fascia was incised in the midline and the peritoneal cavity was entered cautiously. A #0-vicryl pursestring suture was placed in the fascia. An Hassan cannula was introduced under direct vision and secured with the pursestring suture. The abdomen was insufflated with carbon dioxide. The laparoscope was introduced and the abdomen was explored. Operative ports were placed in the right upper quadrant and left lower quadrant.  The appendix is oriented inferiorly extending from the base of the cecum.  It appears thickened and inflamed but without obvious perforation.  However there is  evidence of diffuse peritonitis with erythema and a small amount of intra-abdominal fluid throughout the abdomen.  Fluid around the area of the appendix is slightly cloudy.  The mesoappendix is grasped and elevated.  Using the harmonic scalpel the mesoappendix is divided down to the base of the appendix where it joins the cecal wall.  Using an Endo GIA stapler 2 sequential staple lines are placed across the base of the appendix and the appendix is excised.  Staple lines are inspected and there is good hemostasis noted. The appendix was placed into an endo-catch bag and withdrawn through the umbilical port. The #0-vicryl pursestring suture was tied securely.  Right lower quadrant was irrigated with warm saline which was evacuated. Good hemostasis was noted. Ports were removed under direct vision. Good hemostasis was noted at the port sites. Pneumoperitoneum was released.  Skin incisions were anesthetized with local anesthetic. Wounds were closed with interrupted 4-0 Monocryl subcuticular sutures. Wounds were washed and dried and Dermabond was applied. The patient was awakened from anesthesia and brought to the recovery room. The patient tolerated the procedure well.  Armandina Gemma, [facility] Office: [phone]

## 2021-12-18 NOTE — Transfer of Care (Signed)
Immediate Anesthesia Transfer of Care Note  Patient: Collin Miller  Procedure(s) Performed: APPENDECTOMY LAPAROSCOPIC (Abdomen)  Patient Location: PACU  Anesthesia Type:General  Level of Consciousness: awake, drowsy and patient cooperative  Airway & Oxygen Therapy: Patient Spontanous Breathing and Patient connected to face mask oxygen  Post-op Assessment: Report given to RN and Post -op Vital signs reviewed and stable  Post vital signs: Reviewed and stable  Last Vitals:  Vitals Value Taken Time  BP 104/75 12/18/21 0900  Temp    Pulse 107 12/18/21 0902  Resp 23 12/18/21 0902  SpO2 99 % 12/18/21 0902  Vitals shown include unvalidated device data.  Last Pain:  Vitals:   12/18/21 0417  TempSrc: Oral  PainSc:          Complications: No notable events documented.

## 2021-12-18 NOTE — ED Notes (Signed)
Pt cleaned with chlorhexidine wipes. All belongings placed in belonging bags including wallet per pt request. Denied securing anything with security.

## 2021-12-18 NOTE — Anesthesia Preprocedure Evaluation (Signed)
Anesthesia Evaluation  Patient identified by MRN, date of birth, ID band Patient awake    Reviewed: Allergy & Precautions, NPO status , Patient's Chart, lab work & pertinent test results  Airway Mallampati: III  TM Distance: <3 FB Neck ROM: Full  Mouth opening: Limited Mouth Opening  Dental no notable dental hx.    Pulmonary neg pulmonary ROS, Current Smoker and Patient abstained from smoking.,    Pulmonary exam normal breath sounds clear to auscultation       Cardiovascular negative cardio ROS   Rhythm:Regular Rate:Tachycardia     Neuro/Psych negative neurological ROS  negative psych ROS   GI/Hepatic Neg liver ROS, Acute appendicitis   Endo/Other  negative endocrine ROS  Renal/GU negative Renal ROS  negative genitourinary   Musculoskeletal negative musculoskeletal ROS (+)   Abdominal   Peds negative pediatric ROS (+)  Hematology negative hematology ROS (+)   Anesthesia Other Findings   Reproductive/Obstetrics negative OB ROS                             Anesthesia Physical Anesthesia Plan  ASA: 2 and emergent  Anesthesia Plan: General   Post-op Pain Management:    Induction: Intravenous and Rapid sequence  PONV Risk Score and Plan: 1 and Treatment may vary due to age or medical condition, Ondansetron, Dexamethasone and Midazolam  Airway Management Planned: Oral ETT  Additional Equipment:   Intra-op Plan:   Post-operative Plan: Extubation in OR  Informed Consent: I have reviewed the patients History and Physical, chart, labs and discussed the procedure including the risks, benefits and alternatives for the proposed anesthesia with the patient or authorized representative who has indicated his/her understanding and acceptance.     Dental advisory given  Plan Discussed with: CRNA, Anesthesiologist and Surgeon  Anesthesia Plan Comments:         Anesthesia Quick  Evaluation

## 2021-12-18 NOTE — H&P (Signed)
Collin Miller is an 51 y.o. male.    Chief Complaint: abdominal pain, acute appendicitis  HPI: Patient is a 51 year old male who presents to the emergency department with 24-hour history of abdominal pain localized to the right lower quadrant.  He denies nausea or emesis.  He denies fevers or chills.  Laboratory studies showed an elevated white blood cell count of 13.5.  CT scan of the abdomen and pelvis demonstrated findings consistent with acute appendicitis.  Patient has had no prior abdominal surgery.  Physical examination is consistent with acute appendicitis.  Patient works at the [employer] in [place].  Past Medical History:  Diagnosis Date   Hyperlipidemia    Limb pain    Tobacco abuse    Vitamin D deficiency     Past Surgical History:  Procedure Laterality Date   COLONOSCOPY  11/21/2021   NO PAST SURGERIES      Family History  Problem Relation Age of Onset   Hyperlipidemia Mother    Prostate cancer Maternal Uncle    Diabetes Maternal Uncle        x 3   Colon cancer Neg Hx    Stomach cancer Neg Hx    Rectal cancer Neg Hx    Social History:  reports that he has been smoking cigarettes. He has a 10.00 pack-year smoking history. He has never used smokeless tobacco. He reports current alcohol use. He reports that he does not use drugs.  Allergies:  Allergies  Allergen Reactions   Oxycodone Other (See Comments)    "Makes me feel funny"    (Not in a hospital admission)   Results for orders placed or performed during the hospital encounter of 12/17/21 (from the past 48 hour(s))  CBC with Differential     Status: Abnormal   Collection Time: 12/17/21  9:27 PM  Result Value Ref Range   WBC 13.5 (H) 4.0 - 10.5 K/uL   RBC 5.59 4.22 - 5.81 MIL/uL   Hemoglobin 17.1 (H) 13.0 - 17.0 g/dL   HCT 51.3 39.0 - 52.0 %   MCV 91.8 80.0 - 100.0 fL   MCH 30.6 26.0 - 34.0 pg   MCHC 33.3 30.0 - 36.0 g/dL   RDW 12.5 11.5 - 15.5 %   Platelets 277 150 - 400 K/uL    nRBC 0.0 0.0 - 0.2 %   Neutrophils Relative % 82 %   Neutro Abs 11.0 (H) 1.7 - 7.7 K/uL   Lymphocytes Relative 11 %   Lymphs Abs 1.5 0.7 - 4.0 K/uL   Monocytes Relative 7 %   Monocytes Absolute 0.9 0.1 - 1.0 K/uL   Eosinophils Relative 0 %   Eosinophils Absolute 0.0 0.0 - 0.5 K/uL   Basophils Relative 0 %   Basophils Absolute 0.0 0.0 - 0.1 K/uL   Immature Granulocytes 0 %   Abs Immature Granulocytes 0.03 0.00 - 0.07 K/uL    Comment: Performed at [facility], [place] [address] [place], [place] [address]  Comprehensive metabolic panel     Status: Abnormal   Collection Time: 12/17/21  9:27 PM  Result Value Ref Range   Sodium 138 135 - 145 mmol/L   Potassium 3.8 3.5 - 5.1 mmol/L   Chloride 103 98 - 111 mmol/L   CO2 25 22 - 32 mmol/L   Glucose, Bld 156 (H) 70 - 99 mg/dL    Comment: Glucose reference range applies only to samples taken after fasting for at least  8 hours.   BUN 14 6 - 20 mg/dL   Creatinine, Ser 1.21 0.61 - 1.24 mg/dL   Calcium 9.5 8.9 - 10.3 mg/dL   Total Protein 7.5 6.5 - 8.1 g/dL   Albumin 4.3 3.5 - 5.0 g/dL   AST 19 15 - 41 U/L   ALT 29 0 - 44 U/L   Alkaline Phosphatase 64 38 - 126 U/L   Total Bilirubin 0.9 0.3 - 1.2 mg/dL   GFR, Estimated >60 >60 mL/min    Comment: (NOTE) Calculated using the CKD-EPI Creatinine Equation (2021)    Anion gap 10 5 - 15    Comment: Performed at [facility], [place] [address], [place], [place] [address]  Lipase, blood     Status: None   Collection Time: 12/17/21  9:27 PM  Result Value Ref Range   Lipase 32 11 - 51 U/L    Comment: Performed at [facility], [place] [address] [place], [place] [address]  Resp Panel by RT-PCR (Flu A&B, Covid) Nasopharyngeal Swab     Status: None   Collection Time: 12/18/21  1:31 AM   Specimen: Nasopharyngeal Swab; Nasopharyngeal(NP) swabs in vial transport medium  Result Value Ref Range   SARS Coronavirus 2 by RT PCR NEGATIVE NEGATIVE    Comment:  (NOTE) SARS-CoV-2 target nucleic acids are NOT DETECTED.  The SARS-CoV-2 RNA is generally detectable in upper respiratory specimens during the acute phase of infection. The lowest concentration of SARS-CoV-2 viral copies this assay can detect is 138 copies/mL. A negative result does not preclude SARS-Cov-2 infection and should not be used as the sole basis for treatment or other patient management decisions. A negative result may occur with  improper specimen collection/handling, submission of specimen other than nasopharyngeal swab, presence of viral mutation(s) within the areas targeted by this assay, and inadequate number of viral copies(<138 copies/mL). A negative result must be combined with clinical observations, patient history, and epidemiological information. The expected result is Negative.  Fact Sheet for Patients:  EntrepreneurPulse.com.au  Fact Sheet for Healthcare Providers:  IncredibleEmployment.be  This test is no t yet approved or cleared by the [place] FDA and  has been authorized for detection and/or diagnosis of SARS-CoV-2 by FDA under an Emergency Use Authorization (EUA). This EUA will remain  in effect (meaning this test can be used) for the duration of the COVID-19 declaration under Section 564(b)(1) of the Act, 21 U.S.C.section 360bbb-3(b)(1), unless the authorization is terminated  or revoked sooner.       Influenza A by PCR NEGATIVE NEGATIVE   Influenza B by PCR NEGATIVE NEGATIVE    Comment: (NOTE) The Xpert Xpress SARS-CoV-2/FLU/RSV plus assay is intended as an aid in the diagnosis of influenza from Nasopharyngeal swab specimens and should not be used as a sole basis for treatment. Nasal washings and aspirates are unacceptable for Xpert Xpress SARS-CoV-2/FLU/RSV testing.  Fact Sheet for Patients: EntrepreneurPulse.com.au  Fact Sheet for Healthcare  Providers: IncredibleEmployment.be  This test is not yet approved or cleared by the [place] FDA and has been authorized for detection and/or diagnosis of SARS-CoV-2 by FDA under an Emergency Use Authorization (EUA). This EUA will remain in effect (meaning this test can be used) for the duration of the COVID-19 declaration under Section 564(b)(1) of the Act, 21 U.S.C. section 360bbb-3(b)(1), unless the authorization is terminated or revoked.  Performed at [facility], [place] [address] [place], [place] [address]    CT Abdomen Pelvis W Contrast  Result Date: 12/18/2021 CLINICAL DATA:  Right lower quadrant abdominal pain EXAM: CT ABDOMEN AND PELVIS WITH CONTRAST TECHNIQUE: Multidetector CT imaging of the abdomen and pelvis was performed using the standard protocol following bolus administration of intravenous contrast. CONTRAST:  38mL OMNIPAQUE IOHEXOL 350 MG/ML SOLN COMPARISON:  None. FINDINGS: LOWER CHEST: Normal. HEPATOBILIARY: Normal hepatic contours. No intra- or extrahepatic biliary dilatation. The gallbladder is normal. PANCREAS: Normal pancreas. No ductal dilatation or peripancreatic fluid collection. SPLEEN: Normal. ADRENALS/URINARY TRACT: The adrenal glands are normal. No hydronephrosis, nephroureterolithiasis or solid renal mass. The urinary bladder is normal for degree of distention STOMACH/BOWEL: There is no hiatal hernia. Normal duodenal course and caliber. No small bowel dilatation or inflammation. No focal colonic abnormality. Appendix location: Sub-cecal (6 o'clock) Diameter: 5 mm Appendicolith: Present Mucosal hyper-enhancement: Present with surrounding inflammatory change Extraluminal gas: None Periappendiceal collection: None VASCULAR/LYMPHATIC: Normal course and caliber of the major abdominal vessels. No abdominal or pelvic lymphadenopathy. REPRODUCTIVE: Normal prostate size with symmetric seminal vesicles. MUSCULOSKELETAL. No bony spinal  canal stenosis or focal osseous abnormality. OTHER: Fat containing left inguinal hernia. IMPRESSION: Acute appendicitis without evidence of perforation or abscess formation. Electronically Signed   By: Ulyses Jarred M.D.   On: 12/18/2021 01:01    Review of Systems  Constitutional: Negative.   HENT: Negative.    Eyes: Negative.   Respiratory: Negative.    Cardiovascular: Negative.   Gastrointestinal:  Positive for abdominal pain.  Endocrine: Negative.   Genitourinary: Negative.   Musculoskeletal: Negative.   Skin: Negative.   Allergic/Immunologic: Negative.   Neurological: Negative.   Hematological: Negative.   Psychiatric/Behavioral: Negative.      Physical Exam   Blood pressure 114/86, pulse (!) 124, temperature 99.5 F (37.5 C), temperature source Oral, resp. rate 18, SpO2 96 %.  CONSTITUTIONAL: no acute distress; conversant; no obvious deformities  EYES: Conjunctiva clear and moist; pupils equal bilaterally  NECK: trachea midline; no thyroid nodularity  LUNGS: respiratory effort normal & unlabored; no wheeze; no rales  CV: rate and rhythm regular; no significant murmur; no edema bilat lower extremities  GI: abdomen is soft with mild distention; abdomen is quiet to auscultation; there is tenderness to palpation in the right lower quadrant with voluntary guarding; there are no palpable masses  MSK: normal range of motion of extremities; no clubbing; no cyanosis  PSYCH: appropriate affect for situation; alert and oriented to person, place, & time  LYMPHATIC: no palpable cervical lymphadenopathy    Assessment/Plan  Acute appendicitis  Abx's initiated in ER  Plan lap appendectomy this AM  I discussed proceeding with laparoscopic appendectomy this morning.  I explained the surgical procedure and the size and location of the surgical incisions.  We discussed the hospital stay to be anticipated.  The patient understands and agrees to proceed.  The risks and  benefits of the procedure have been discussed at length with the patient.  The patient understands the proposed procedure, potential alternative treatments, and the course of recovery to be expected.  All of the patient's questions have been answered at this time.  The patient wishes to proceed with surgery.  Armandina Gemma, MD [facility] [place] [facility] Office: [phone]   Armandina Gemma, MD 12/18/2021, 5:49 AM

## 2021-12-18 NOTE — Anesthesia Procedure Notes (Signed)
Procedure Name: Intubation Date/Time: 12/18/2021 8:03 AM Performed by: Raenette Rover, CRNA Pre-anesthesia Checklist: Patient identified, Emergency Drugs available, Suction available and Patient being monitored Patient Re-evaluated:Patient Re-evaluated prior to induction Oxygen Delivery Method: Circle system utilized Preoxygenation: Pre-oxygenation with 100% oxygen Induction Type: IV induction Ventilation: Mask ventilation without difficulty Laryngoscope Size: Mac and 4 Grade View: Grade II Tube type: Oral Tube size: 7.5 mm Number of attempts: 1 Airway Equipment and Method: Stylet Placement Confirmation: ETT inserted through vocal cords under direct vision, positive ETCO2 and breath sounds checked- equal and bilateral Secured at: 22 cm Tube secured with: Tape Dental Injury: Teeth and Oropharynx as per pre-operative assessment

## 2021-12-18 NOTE — Plan of Care (Signed)
  Problem: Education: Goal: Knowledge of General Education information will improve Description Including pain rating scale, medication(s)/side effects and non-pharmacologic comfort measures Outcome: Progressing   

## 2021-12-18 NOTE — Anesthesia Postprocedure Evaluation (Signed)
Anesthesia Post Note  Patient: Collin Miller  Procedure(s) Performed: APPENDECTOMY LAPAROSCOPIC (Abdomen)     Patient location during evaluation: PACU Anesthesia Type: General Level of consciousness: awake Pain management: pain level controlled Vital Signs Assessment: post-procedure vital signs reviewed and stable Respiratory status: spontaneous breathing and respiratory function stable Cardiovascular status: stable Postop Assessment: no apparent nausea or vomiting Anesthetic complications: no   No notable events documented.  Last Vitals:  Vitals:   12/18/21 0945 12/18/21 1000  BP: 99/63 94/69  Pulse: 91 90  Resp: 15 14  Temp:  36.8 C  SpO2: 97% 100%    Last Pain:  Vitals:   12/18/21 1000  TempSrc:   PainSc: Asleep                 Merlinda Frederick

## 2021-12-19 ENCOUNTER — Encounter (HOSPITAL_COMMUNITY): Payer: Self-pay | Admitting: Surgery

## 2021-12-19 MED ORDER — AMOXICILLIN-POT CLAVULANATE 875-125 MG PO TABS: 1.0000 | ORAL_TABLET | Freq: Two times a day (BID) | ORAL | 0 refills | Status: AC

## 2021-12-19 MED ORDER — ACETAMINOPHEN 325 MG PO TABS
650.0000 mg | ORAL_TABLET | Freq: Four times a day (QID) | ORAL | Status: AC | PRN
Start: 2021-12-19 — End: ?

## 2021-12-19 MED ORDER — IBUPROFEN 200 MG PO TABS: 400.0000 mg | ORAL_TABLET | Freq: Four times a day (QID) | ORAL | Status: AC | PRN

## 2021-12-19 MED ORDER — TRAMADOL HCL 50 MG PO TABS: 50.0000 mg | ORAL_TABLET | Freq: Four times a day (QID) | ORAL | 0 refills | Status: AC | PRN

## 2021-12-19 NOTE — Progress Notes (Signed)
Discharge package printed and instructions given to patient. Verbalizes understanding. Patient is waiting for his ride.

## 2021-12-19 NOTE — Progress Notes (Signed)
Transition of Care (TOC) Screening Note  Patient Details  Name: Collin Miller Date of Birth: [birth year]  Transition of Care [facility]) CM/SW Contact:    Sherie Don, LCSW Phone Number: 12/19/2021, 8:50 AM  Transition of Care Department [facility]) has reviewed patient and no TOC needs have been identified at this time. We will continue to monitor patient advancement through interdisciplinary progression rounds. If new patient transition needs arise, please place a TOC consult.

## 2021-12-19 NOTE — Discharge Summary (Signed)
[facility] Discharge Summary   Patient ID: Collin Miller MRN: [id number] DOB/AGE: [birth year] 51 y.o.  Admit date: 12/17/2021 Discharge date: 12/19/2021  Admitting Diagnosis: Acute appendicitis  Discharge Diagnosis Patient Active Problem List   Diagnosis Date Noted   Appendicitis, acute 12/18/2021   Acute appendicitis 12/18/2021    Consultants None   Imaging: CT Abdomen Pelvis W Contrast  Result Date: 12/18/2021 CLINICAL DATA:  Right lower quadrant abdominal pain EXAM: CT ABDOMEN AND PELVIS WITH CONTRAST TECHNIQUE: Multidetector CT imaging of the abdomen and pelvis was performed using the standard protocol following bolus administration of intravenous contrast. CONTRAST:  17mL OMNIPAQUE IOHEXOL 350 MG/ML SOLN COMPARISON:  None. FINDINGS: LOWER CHEST: Normal. HEPATOBILIARY: Normal hepatic contours. No intra- or extrahepatic biliary dilatation. The gallbladder is normal. PANCREAS: Normal pancreas. No ductal dilatation or peripancreatic fluid collection. SPLEEN: Normal. ADRENALS/URINARY TRACT: The adrenal glands are normal. No hydronephrosis, nephroureterolithiasis or solid renal mass. The urinary bladder is normal for degree of distention STOMACH/BOWEL: There is no hiatal hernia. Normal duodenal course and caliber. No small bowel dilatation or inflammation. No focal colonic abnormality. Appendix location: Sub-cecal (6 o'clock) Diameter: 5 mm Appendicolith: Present Mucosal hyper-enhancement: Present with surrounding inflammatory change Extraluminal gas: None Periappendiceal collection: None VASCULAR/LYMPHATIC: Normal course and caliber of the major abdominal vessels. No abdominal or pelvic lymphadenopathy. REPRODUCTIVE: Normal prostate size with symmetric seminal vesicles. MUSCULOSKELETAL. No bony spinal canal stenosis or focal osseous abnormality. OTHER: Fat containing left inguinal hernia. IMPRESSION: Acute appendicitis without evidence of perforation or abscess formation.  Electronically Signed   By: Ulyses Jarred M.D.   On: 12/18/2021 01:01    Procedures Dr. Armandina Gemma (12/18/21) - Laparoscopic Appendectomy  Hospital Course:  Patient is a 51 year old male who presented to the ED with abdominal pain.  Workup showed acute appendicitis.  Patient was admitted and underwent procedure listed above.  Tolerated procedure well and was transferred to the floor.  Diet was advanced as tolerated.  On POD1, the patient was voiding well, tolerating diet, ambulating well, pain well controlled, vital signs stable, incisions c/d/i and felt stable for discharge home.  Patient will follow up in our office in 3-4 weeks and knows to call with questions or concerns.  He will call to confirm appointment date/time.    Physical Exam: General:  Alert, NAD, pleasant, comfortable Abd:  Soft, mild distention, +BS, mild tenderness, incisions C/D/I   I or a member of my team have reviewed this patient in the Controlled Substance Database.   Allergies as of 12/19/2021       Reactions   Oxycodone Other (See Comments)   "Makes me feel funny"        Medication List     TAKE these medications    acetaminophen 325 MG tablet Commonly known as: TYLENOL Take 2 tablets (650 mg total) by mouth every 6 (six) hours as needed for mild pain, fever or headache. What changed:  how much to take when to take this reasons to take this   amoxicillin-clavulanate 875-125 MG tablet Commonly known as: Augmentin Take 1 tablet by mouth every 12 (twelve) hours for 5 days.   atorvastatin 10 MG tablet Commonly known as: LIPITOR Take 10 mg by mouth at bedtime.   cetirizine 10 MG tablet Commonly known as: ZYRTEC Take 10 mg by mouth daily.   ibuprofen 200 MG tablet Commonly known as: ADVIL Take 2 tablets (400 mg total) by mouth every 6 (six) hours as needed for moderate pain. What changed:  how much to take when to take this reasons to take this   traMADol 50 MG tablet Commonly known as:  ULTRAM Take 1 tablet (50 mg total) by mouth every 6 (six) hours as needed for severe pain or moderate pain.   Vitamin D3 10 MCG (400 UNIT) Caps Take 1 tablet by mouth daily.          Follow-up Information     [facility], [place]. Schedule an appointment as soon as possible for a visit in 3 week(s).   Specialty: General Surgery Why: Our office is scheduling a follow up appointment in 3-4 weeks. Please call to confirm appointment date/time. Bring photo ID and insurance information with you. Contact information: [place] [address] [place] [address] [phone]                 Signed: Norm Parcel , [facility] 12/19/2021, 8:37 AM Please see Amion for pager number during day hours 7:00am-4:30pm

## 2021-12-19 NOTE — Plan of Care (Signed)
  Problem: Pain Managment: Goal: General experience of comfort will improve Outcome: Progressing   

## 2021-12-19 NOTE — Plan of Care (Signed)
°  Problem: Safety: Goal: Ability to remain free from injury will improve 12/19/2021 0738 by Mayme Genta, RN Outcome: Progressing 12/19/2021 0723 by Mayme Genta, RN Outcome: Progressing   Problem: Pain Managment: Goal: General experience of comfort will improve 12/19/2021 0738 by Mayme Genta, RN Outcome: Progressing 12/19/2021 0723 by Mayme Genta, RN Outcome: Progressing

## 2021-12-19 NOTE — Plan of Care (Signed)
  Problem: Activity: Goal: Risk for activity intolerance will decrease Outcome: Progressing   Problem: Pain Managment: Goal: General experience of comfort will improve Outcome: Progressing   Problem: Safety: Goal: Ability to remain free from injury will improve Outcome: Progressing   

## 2021-12-19 NOTE — Discharge Instructions (Signed)
[facility] LAPAROSCOPIC SURGERY: POST OP INSTRUCTIONS Always review your discharge instruction sheet given to you by the facility where your surgery was performed. IF YOU HAVE DISABILITY OR FAMILY LEAVE FORMS, YOU MUST BRING THEM TO THE OFFICE FOR PROCESSING.   DO NOT GIVE THEM TO YOUR DOCTOR.  PAIN CONTROL  First take acetaminophen (Tylenol) AND/or ibuprofen (Advil) to control your pain after surgery.  Follow directions on package.  Taking acetaminophen (Tylenol) and/or ibuprofen (Advil) regularly after surgery will help to control your pain and lower the amount of prescription pain medication you may need.  You should not take more than 3,000 mg (3 grams) of acetaminophen (Tylenol) in 24 hours.  You should not take ibuprofen (Advil), aleve, motrin, naprosyn or other NSAIDS if you have a history of stomach ulcers or chronic kidney disease.  A prescription for pain medication may be given to you upon discharge.  Take your pain medication as prescribed, if you still have uncontrolled pain after taking acetaminophen (Tylenol) or ibuprofen (Advil). Use ice packs to help control pain. If you need a refill on your pain medication, please contact your pharmacy.  They will contact our office to request authorization. Prescriptions will not be filled after 5pm or on week-ends.  HOME MEDICATIONS Take your usually prescribed medications unless otherwise directed.  DIET You should follow a light diet the first few days after arrival home.  Be sure to include lots of fluids daily. Avoid fatty, fried foods.   CONSTIPATION It is common to experience some constipation after surgery and if you are taking pain medication.  Increasing fluid intake and taking a stool softener (such as Colace) will usually help or prevent this problem from occurring.  A mild laxative (Milk of Magnesia or Miralax) should be taken according to package instructions if there are no bowel movements after 48  hours.  WOUND/INCISION CARE Most patients will experience some swelling and bruising in the area of the incisions.  Ice packs will help.  Swelling and bruising can take several days to resolve.  Unless discharge instructions indicate otherwise, follow guidelines below  STERI-STRIPS - you may remove your outer bandages 48 hours after surgery, and you may shower at that time.  You have steri-strips (small skin tapes) in place directly over the incision.  These strips should be left on the skin for 7-10 days.   DERMABOND/SKIN GLUE - you may shower in 24 hours.  The glue will flake off over the next 2-3 weeks. Any sutures or staples will be removed at the office during your follow-up visit.  ACTIVITIES You may resume regular (light) daily activities beginning the next day--such as daily self-care, walking, climbing stairs--gradually increasing activities as tolerated.  You may have sexual intercourse when it is comfortable.  Refrain from any heavy lifting or straining until approved by your doctor. You may drive when you are no longer taking prescription pain medication, you can comfortably wear a seatbelt, and you can safely maneuver your car and apply brakes.  FOLLOW-UP You should see your doctor in the office for a follow-up appointment approximately 2-3 weeks after your surgery.  You should have been given your post-op/follow-up appointment when your surgery was scheduled.  If you did not receive a post-op/follow-up appointment, make sure that you call for this appointment within a day or two after you arrive home to insure a convenient appointment time.   WHEN TO CALL YOUR DOCTOR: Fever over 101.0 Inability to urinate Continued bleeding from incision.   Increased pain, redness, or drainage from the incision. Increasing abdominal pain  The clinic staff is available to answer your questions during regular business hours.  Please don't hesitate to call and ask to speak to one of the nurses for  clinical concerns.  If you have a medical emergency, go to the nearest emergency room or call 911.  A surgeon from [facility] is always on call at the hospital. [address], [address], [place],   [address] ? [address] [address], [place],    [address] [phone] ? [phone] ? FAX [phone] Web site: [email or web address]      Managing Your Pain After Surgery Without Opioids    Thank you for participating in our program to help patients manage their pain after surgery without opioids. This is part of our effort to provide you with the best care possible, without exposing you or your family to the risk that opioids pose.  What pain can I expect after surgery? You can expect to have some pain after surgery. This is normal. The pain is typically worse the day after surgery, and quickly begins to get better. Many studies have found that many patients are able to manage their pain after surgery with Over-the-Counter (OTC) medications such as Tylenol and Motrin. If you have a condition that does not allow you to take Tylenol or Motrin, notify your surgical team.  How will I manage my pain? The best strategy for controlling your pain after surgery is around the clock pain control with Tylenol (acetaminophen) and Motrin (ibuprofen or Advil). Alternating these medications with each other allows you to maximize your pain control. In addition to Tylenol and Motrin, you can use heating pads or ice packs on your incisions to help reduce your pain.  How will I alternate your regular strength over-the-counter pain medication? You will take a dose of pain medication every three hours. Start by taking 650 mg of Tylenol (2 pills of 325 mg) 3 hours later take 600 mg of Motrin (3 pills of 200 mg) 3 hours after taking the Motrin take 650 mg of Tylenol 3 hours after that take 600 mg of Motrin.   - 1 -  See example - if your first dose of Tylenol is at 12:00  PM   12:00 PM Tylenol 650 mg (2 pills of 325 mg)  3:00 PM Motrin 600 mg (3 pills of 200 mg)  6:00 PM Tylenol 650 mg (2 pills of 325 mg)  9:00 PM Motrin 600 mg (3 pills of 200 mg)  Continue alternating every 3 hours   We recommend that you follow this schedule around-the-clock for at least 3 days after surgery, or until you feel that it is no longer needed. Use the table on the last page of this handout to keep track of the medications you are taking. Important: Do not take more than 3000mg of Tylenol or 3200mg of Motrin in a 24-hour period. Do not take ibuprofen/Motrin if you have a history of bleeding stomach ulcers, severe kidney disease, &/or actively taking a blood thinner  What if I still have pain? If you have pain that is not controlled with the over-the-counter pain medications (Tylenol and Motrin or Advil) you might have what we call "breakthrough" pain. You will receive a prescription for a small amount of an opioid pain medication such as Oxycodone, Tramadol, or Tylenol with Codeine. Use these opioid pills in the first 24 hours after surgery if you have breakthrough pain. Do   not take more than 1 pill every 4-6 hours.  If you still have uncontrolled pain after using all opioid pills, don't hesitate to call our staff using the number provided. We will help make sure you are managing your pain in the best way possible, and if necessary, we can provide a prescription for additional pain medication.   Day 1    Time  Name of Medication Number of pills taken  Amount of Acetaminophen  Pain Level   Comments  AM PM       AM PM       AM PM       AM PM       AM PM       AM PM       AM PM       AM PM       Total Daily amount of Acetaminophen Do not take more than  3,000 mg per day      Day 2    Time  Name of Medication Number of pills taken  Amount of Acetaminophen  Pain Level   Comments  AM PM       AM PM       AM PM       AM PM       AM PM       AM PM       AM  PM       AM PM       Total Daily amount of Acetaminophen Do not take more than  3,000 mg per day      Day 3    Time  Name of Medication Number of pills taken  Amount of Acetaminophen  Pain Level   Comments  AM PM       AM PM       AM PM       AM PM          AM PM       AM PM       AM PM       AM PM       Total Daily amount of Acetaminophen Do not take more than  3,000 mg per day      Day 4    Time  Name of Medication Number of pills taken  Amount of Acetaminophen  Pain Level   Comments  AM PM       AM PM       AM PM       AM PM       AM PM       AM PM       AM PM       AM PM       Total Daily amount of Acetaminophen Do not take more than  3,000 mg per day      Day 5    Time  Name of Medication Number of pills taken  Amount of Acetaminophen  Pain Level   Comments  AM PM       AM PM       AM PM       AM PM       AM PM       AM PM       AM PM       AM PM       Total Daily amount of Acetaminophen Do not take more than    3,000 mg per day       Day 6    Time  Name of Medication Number of pills taken  Amount of Acetaminophen  Pain Level  Comments  AM PM       AM PM       AM PM       AM PM       AM PM       AM PM       AM PM       AM PM       Total Daily amount of Acetaminophen Do not take more than  3,000 mg per day      Day 7    Time  Name of Medication Number of pills taken  Amount of Acetaminophen  Pain Level   Comments  AM PM       AM PM       AM PM       AM PM       AM PM       AM PM       AM PM       AM PM       Total Daily amount of Acetaminophen Do not take more than  3,000 mg per day        For additional information about how and where to safely dispose of unused opioid medications - https://www.morepowerfulnc.org  Disclaimer: This document contains information and/or instructional materials adapted from [facility] for the typical patient with your condition. It does not replace medical advice  from your health care provider because your experience may differ from that of the typical patient. Talk to your health care provider if you have any questions about this document, your condition or your treatment plan. Adapted from [facility]  

## 2021-12-20 LAB — SURGICAL PATHOLOGY

## 2022-03-01 ENCOUNTER — Encounter: Payer: Self-pay | Admitting: Gastroenterology

## 2022-10-23 IMAGING — CT CT ABD-PELV W/ CM
2 of 5 series · 16 of 46 positions shown, 18 images · IV contrast (omnipaque)
Comparison: None.

CLINICAL DATA: Right lower quadrant abdominal pain

EXAM:
CT ABDOMEN AND PELVIS WITH CONTRAST
TECHNIQUE: Multidetector CT imaging of the abdomen and pelvis was performed
using the standard protocol following bolus administration of
intravenous contrast.
CONTRAST:  80mL OMNIPAQUE IOHEXOL 350 MG/ML SOLN

[Series 2: axial st · axial · 0.83mm/px · z∈[-448,+12]mm · 13 of 106 slices shown, 15 images]
[im 7/106  soft-tissue]
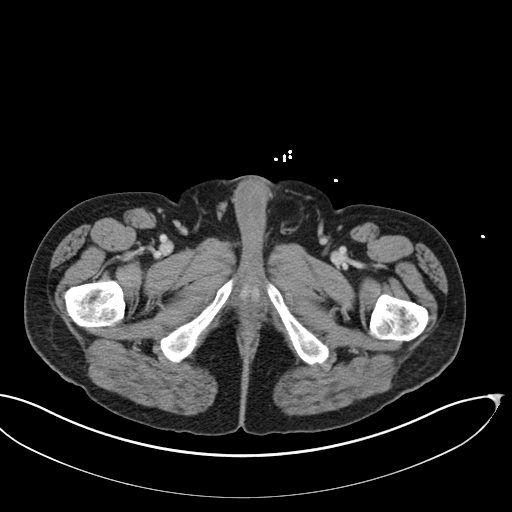
[im 7/106  bone]
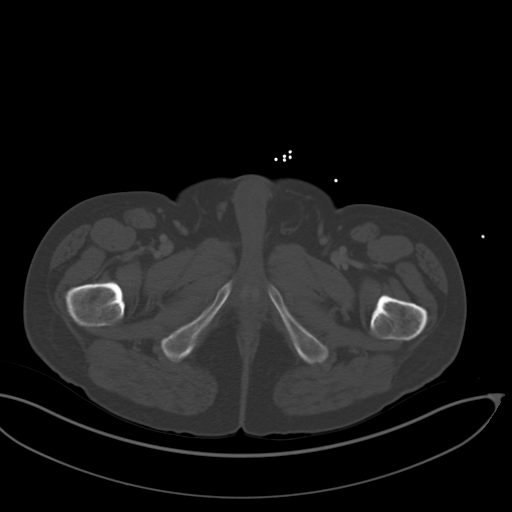
[im 14/106  soft-tissue]
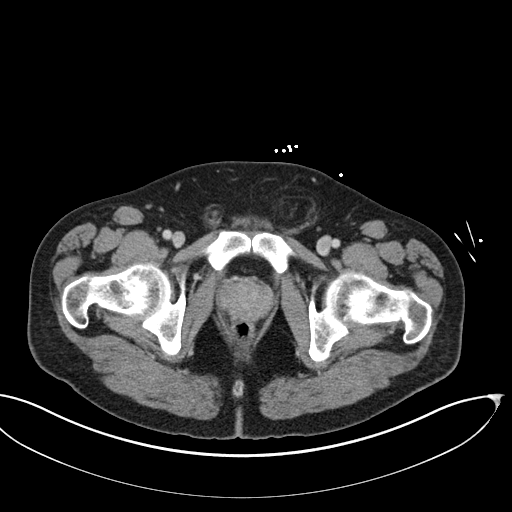
[im 20/106  soft-tissue]
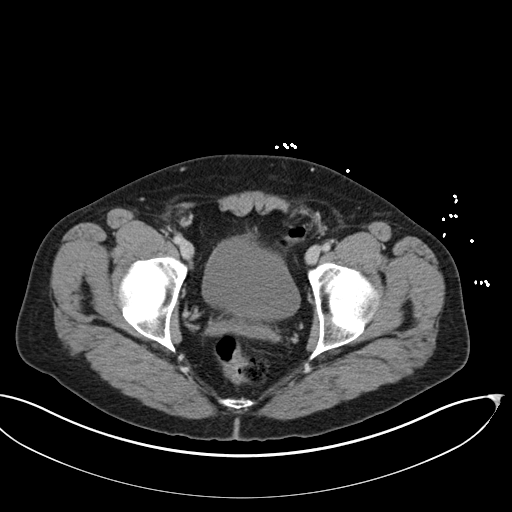
[im 33/106  soft-tissue]
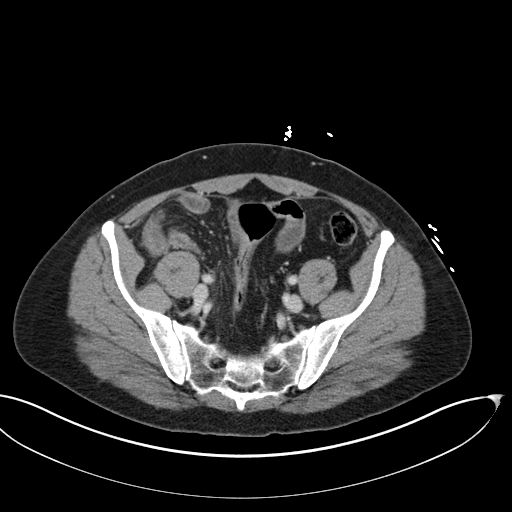
[im 40/106  soft-tissue]
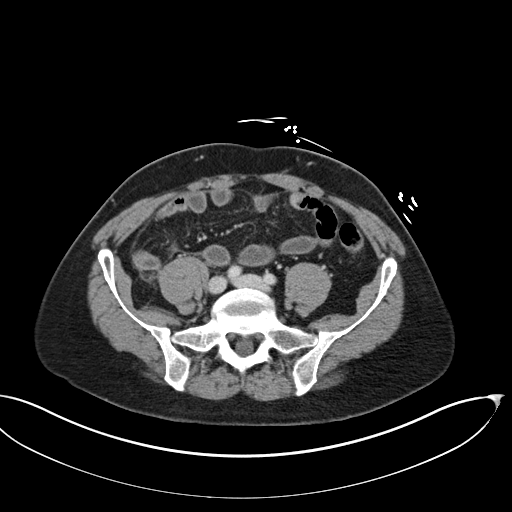
[im 46/106  soft-tissue]
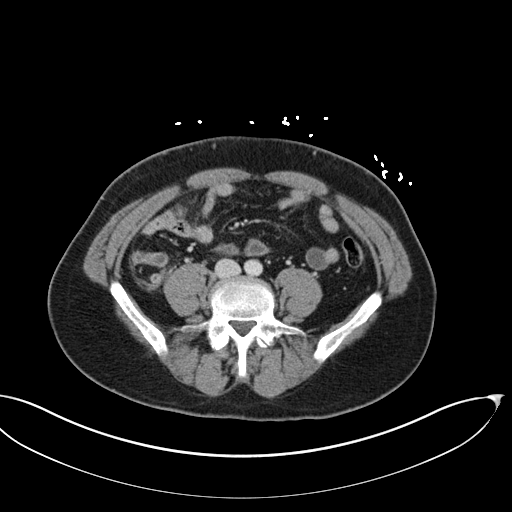
[im 53/106  soft-tissue]
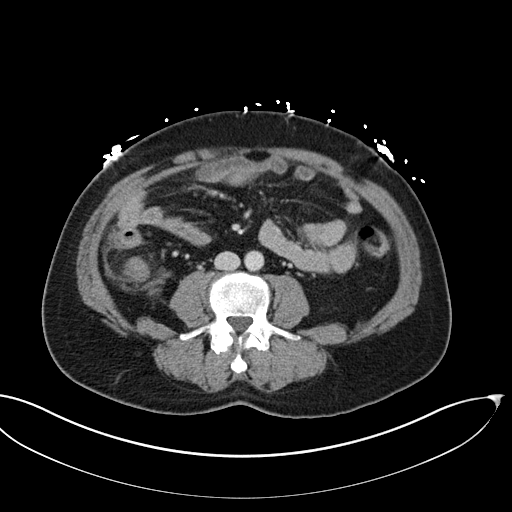
[im 60/106  soft-tissue]
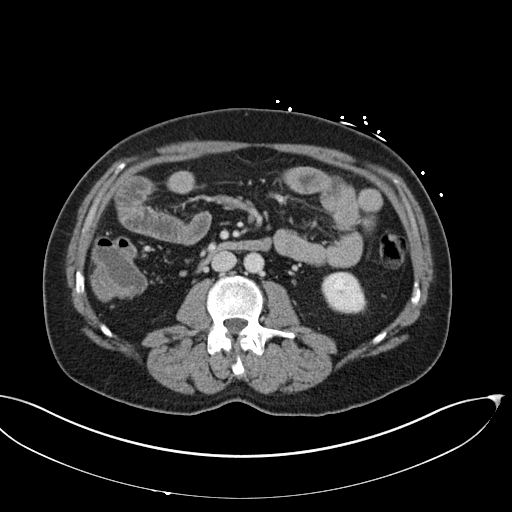
[im 66/106  soft-tissue]
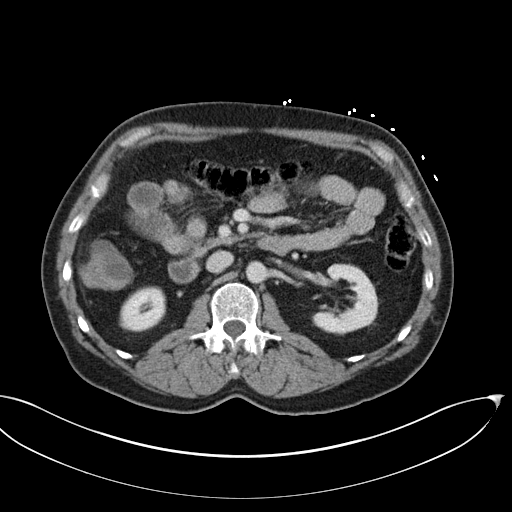
[im 66/106  bone]
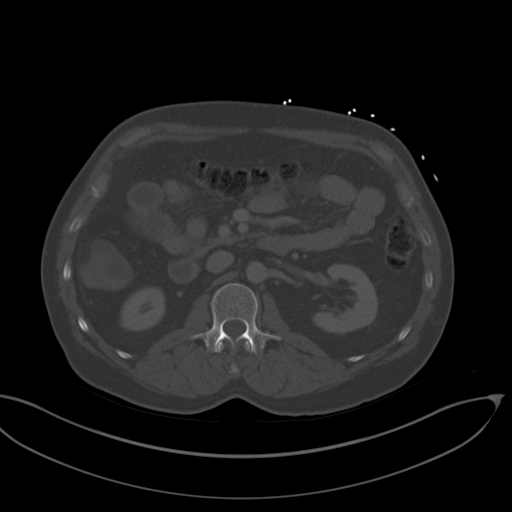
[im 73/106  soft-tissue]
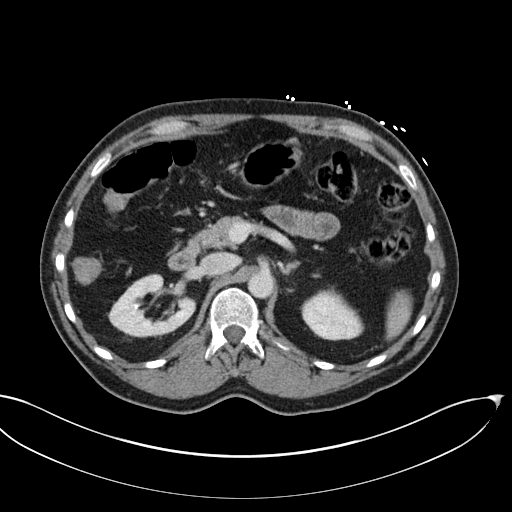
[im 86/106  soft-tissue]
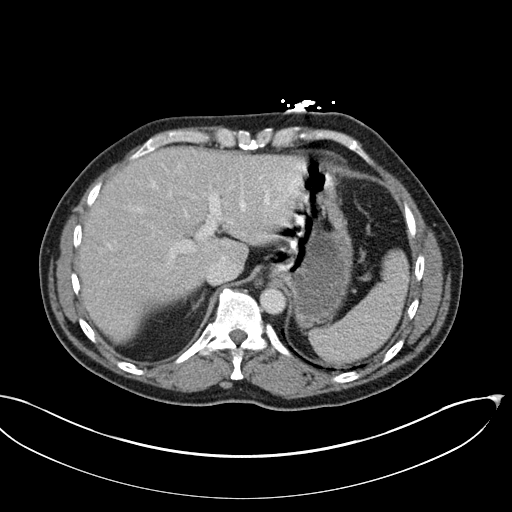
[im 92/106  soft-tissue]
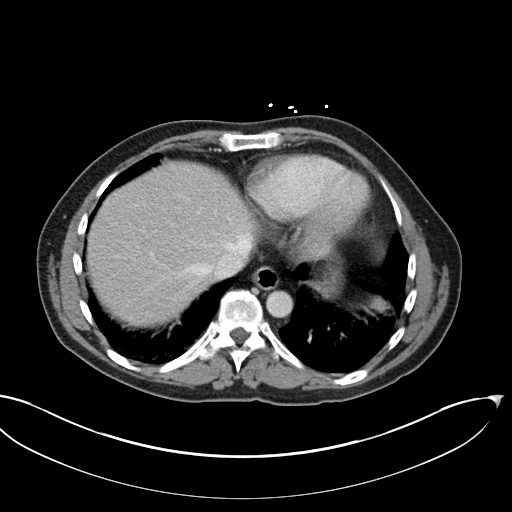
[im 99/106  soft-tissue]
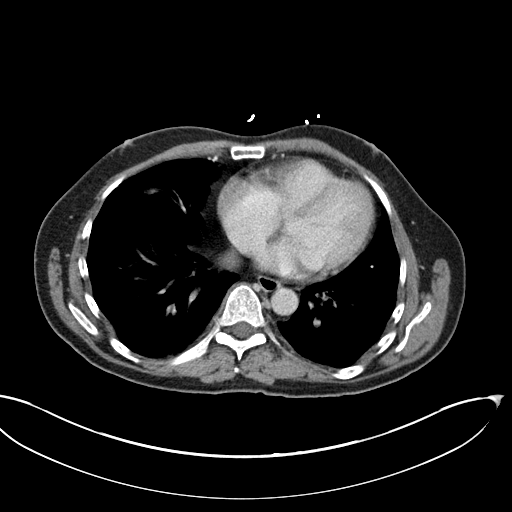

[Series 5: coronal st · coronal · 0.77mm/px · 3 of 151 slices shown]
[im 51/151  soft-tissue]
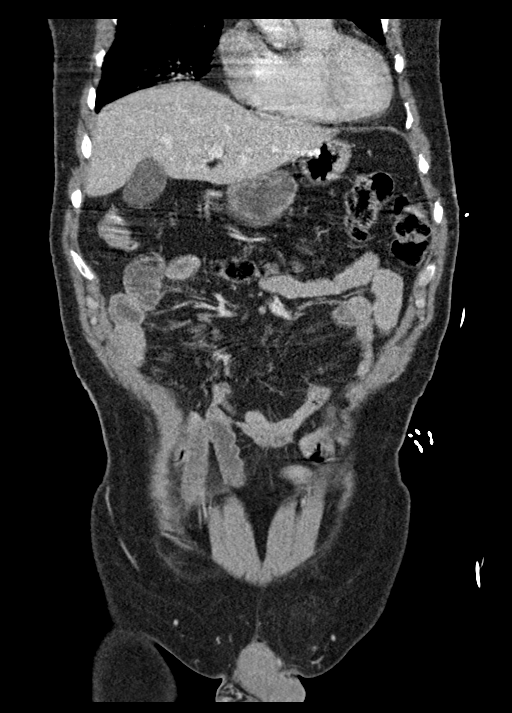
[im 67/151  soft-tissue]
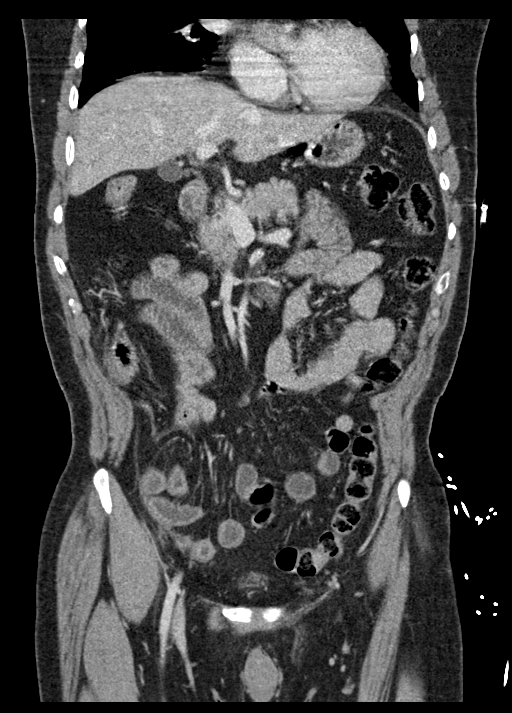
[im 84/151  soft-tissue]
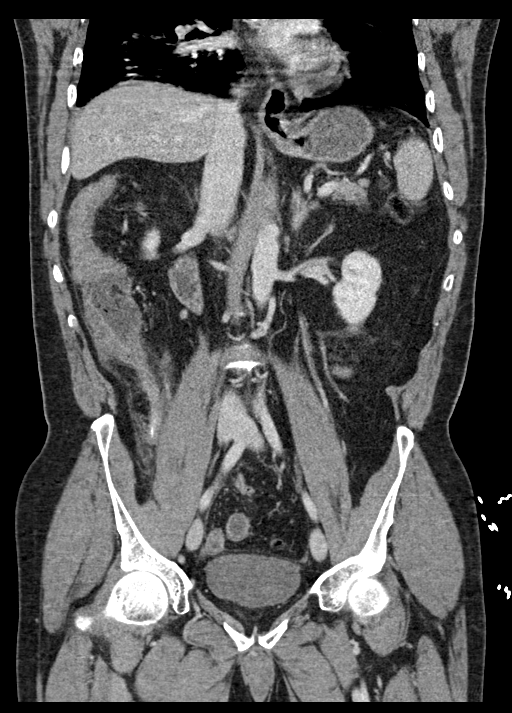

[16 of 46 positions shown; findings below may reference images not displayed]

FINDINGS: LOWER CHEST: Normal.

HEPATOBILIARY: Normal hepatic contours. No intra- or extrahepatic
biliary dilatation. The gallbladder is normal.

PANCREAS: Normal pancreas. No ductal dilatation or peripancreatic
fluid collection.

SPLEEN: Normal.

ADRENALS/URINARY TRACT: The adrenal glands are normal. No
hydronephrosis, nephroureterolithiasis or solid renal mass. The
urinary bladder is normal for degree of distention

STOMACH/BOWEL: There is no hiatal hernia. Normal duodenal course and
caliber. No small bowel dilatation or inflammation. No focal colonic
abnormality.

Appendix location: Sub-cecal (6 o'clock)

Diameter: 5 mm

Appendicolith: Present

Mucosal hyper-enhancement: Present with surrounding inflammatory
change

Extraluminal gas: None

Periappendiceal collection: None

VASCULAR/LYMPHATIC: Normal course and caliber of the major abdominal
vessels. No abdominal or pelvic lymphadenopathy.

REPRODUCTIVE: Normal prostate size with symmetric seminal vesicles.

MUSCULOSKELETAL. No bony spinal canal stenosis or focal osseous
abnormality.

OTHER: Fat containing left inguinal hernia.
IMPRESSION: Acute appendicitis without evidence of perforation or abscess
formation.

## 2024-01-08 ENCOUNTER — Encounter (HOSPITAL_COMMUNITY): Payer: Self-pay

## 2024-01-08 ENCOUNTER — Other Ambulatory Visit: Payer: Self-pay

## 2024-01-08 ENCOUNTER — Emergency Department (HOSPITAL_COMMUNITY)
Admission: EM | Admit: 2024-01-08 | Discharge: 2024-01-08 | Disposition: A | Discharge status: Home / Self Care | Payer: 59 | Attending: Emergency Medicine | Admitting: Emergency Medicine

## 2024-01-08 DIAGNOSIS — Z20822 Contact with and (suspected) exposure to covid-19: Secondary | ICD-10-CM | POA: Insufficient documentation

## 2024-01-08 DIAGNOSIS — R519 Headache, unspecified: Secondary | ICD-10-CM | POA: Insufficient documentation

## 2024-01-08 DIAGNOSIS — R051 Acute cough: Secondary | ICD-10-CM

## 2024-01-08 DIAGNOSIS — R059 Cough, unspecified: Secondary | ICD-10-CM | POA: Diagnosis present

## 2024-01-08 DIAGNOSIS — R509 Fever, unspecified: Secondary | ICD-10-CM | POA: Insufficient documentation

## 2024-01-08 DIAGNOSIS — J210 Acute bronchiolitis due to respiratory syncytial virus: Secondary | ICD-10-CM

## 2024-01-08 DIAGNOSIS — R0989 Other specified symptoms and signs involving the circulatory and respiratory systems: Secondary | ICD-10-CM | POA: Diagnosis not present

## 2024-01-08 DIAGNOSIS — J029 Acute pharyngitis, unspecified: Secondary | ICD-10-CM | POA: Insufficient documentation

## 2024-01-08 LAB — RESP PANEL BY RT-PCR (RSV, FLU A&B, COVID)  RVPGX2
Influenza A by PCR: NEGATIVE
Influenza B by PCR: NEGATIVE
Resp Syncytial Virus by PCR: POSITIVE — AB
SARS Coronavirus 2 by RT PCR: NEGATIVE

## 2024-01-08 MED ORDER — PREDNISONE 20 MG PO TABS: 40.0000 mg | ORAL_TABLET | Freq: Every day | ORAL | 0 refills | Status: AC

## 2024-01-08 MED ORDER — HYDROCOD POLI-CHLORPHE POLI ER 10-8 MG/5ML PO SUER: 5.0000 mL | Freq: Every evening | ORAL | 0 refills | Status: AC | PRN

## 2024-01-08 NOTE — Discharge Instructions (Addendum)
Please take the entire course of steroids that I prescribed, you can use the medicated cough syrup I prescribed at night to help with cough, sleep.  You can continue to use over-the-counter cough and cold medication as needed for body aches, other lingering congestion.  Please follow-up with your primary care doctor.  It may take another several weeks for the cough to fully resolve

## 2024-01-08 NOTE — ED Provider Notes (Signed)
[place] EMERGENCY DEPARTMENT AT [facility] Provider Note   CSN: [id number] Arrival date & time: 01/08/24  1417     History  Chief Complaint  Patient presents with   Cough    Burley Kopka Cogan is a 53 y.o. male with overall noncontributory past medical history presents concern for cough, runny nose, sore throat for 1 weeks.  Reports difficulty sleeping secondary to cough.  Denies shortness of breath, denies chest pain.  Denies any known sick contacts.  Reports history of tobacco abuse but does not smoke anymore.   Cough      Home Medications Prior to Admission medications   Medication Sig Start Date End Date Taking? Authorizing Provider  acetaminophen (TYLENOL) 325 MG tablet Take 2 tablets (650 mg total) by mouth every 6 (six) hours as needed for mild pain, fever or headache. 12/19/21   Juliet Rude, PA-C  atorvastatin (LIPITOR) 10 MG tablet Take 10 mg by mouth at bedtime.    [provider]  cetirizine (ZYRTEC) 10 MG tablet Take 10 mg by mouth daily.    [provider]  Cholecalciferol (VITAMIN D3) 10 MCG (400 UNIT) CAPS Take 1 tablet by mouth daily.    [provider]  ibuprofen (ADVIL) 200 MG tablet Take 2 tablets (400 mg total) by mouth every 6 (six) hours as needed for moderate pain. 12/19/21   Juliet Rude, PA-C  traMADol (ULTRAM) 50 MG tablet Take 1 tablet (50 mg total) by mouth every 6 (six) hours as needed for severe pain or moderate pain. 12/19/21   Juliet Rude, PA-C      Allergies    Bee venom and Oxycodone    Review of Systems   Review of Systems  Respiratory:  Positive for cough.   All other systems reviewed and are negative.   Physical Exam Updated Vital Signs BP (!) 139/102 (BP Location: Right Arm)   Pulse 100   Temp 97.6 F (36.4 C) (Oral)   Resp 16   Wt 83.5 kg   SpO2 96%   BMI 24.95 kg/m  Physical Exam Vitals and nursing note reviewed.  Constitutional:      General: He is not in acute distress.     Appearance: Normal appearance.  HENT:     Head: Normocephalic and atraumatic.     Mouth/Throat:     Comments: No significant posterior oropharynx erythema, swelling, exudate. Uvula midline, tonsils 1+ bilaterally.  No trismus, stridor, evidence of PTA, floor of mouth swelling or redness.   Eyes:     General:        Right eye: No discharge.        Left eye: No discharge.  Cardiovascular:     Rate and Rhythm: Normal rate and regular rhythm.     Heart sounds: No murmur heard.    No friction rub. No gallop.  Pulmonary:     Effort: Pulmonary effort is normal.     Breath sounds: Normal breath sounds.     Comments: Occasional wet sounding cough.  No wheezing, rhonchi, stridor, rales.  No respiratory distress. Abdominal:     General: Bowel sounds are normal.     Palpations: Abdomen is soft.  Skin:    General: Skin is warm and dry.     Capillary Refill: Capillary refill takes less than 2 seconds.  Neurological:     Mental Status: He is alert and oriented to person, place, and time.  Psychiatric:  Mood and Affect: Mood normal.        Behavior: Behavior normal.     ED Results / Procedures / Treatments   Labs (all labs ordered are listed, but only abnormal results are displayed) Labs Reviewed  RESP PANEL BY RT-PCR (RSV, FLU A&B, COVID)  RVPGX2 - Abnormal; Notable for the following components:      Result Value   Resp Syncytial Virus by PCR POSITIVE (*)    All other components within normal limits    EKG None  Radiology No results found.  Procedures Procedures    Medications Ordered in ED Medications - No data to display  ED Course/ Medical Decision Making/ A&P                                 Medical Decision Making  This is a well-appearing 53yo male who presents with concern for 7-10 days of cough, fever, sore throat, headache.  My emergent differential diagnosis includes acute upper respiratory infection with COVID, flu, RSV versus new asthma presentation,  acute bronchitis, less clinical concern for pneumonia.  Also considered other ENT emergencies, Ludwig angina, strep pharyngitis, mono, versus epiglottis, tonsillitis versus other.  This is not an exhaustive differential.  On my exam patient is overall well-appearing, they have temperature of 97.6, breathing unlabored, no tachypnea, no respiratory distress, stable oxygen saturation.  Patient   Bilateral TMs are clear.  RVP independently reviewed by myself shows positive for RSV.  Patient symptoms are consistent with RSV, post-viral cough  Encouraged ibuprofen, Tylenol, rest, plenty of fluids.  Given cough as patient is worse symptom with difficulty sleeping will discharge with prednisone, and Tussionex for probable developing acute bronchitis.  Low clinical concern for developing pneumonia with no focal consolidation, no fever. Discussed extensive return precautions.  Patient discharged in stable condition at this time.  Final Clinical Impression(s) / ED Diagnoses Final diagnoses:  None    Rx / DC Orders ED Discharge Orders     None         [place] 01/08/24 1844    Maia Plan, MD 01/11/24 1546

## 2024-01-08 NOTE — ED Triage Notes (Signed)
C/o cough, runny nose, and sore throat x1 week.

## 2024-12-01 ENCOUNTER — Other Ambulatory Visit: Payer: Self-pay

## 2024-12-01 ENCOUNTER — Emergency Department (HOSPITAL_COMMUNITY)

## 2024-12-01 ENCOUNTER — Emergency Department (HOSPITAL_COMMUNITY): Admission: EM | Admit: 2024-12-01 | Discharge: 2024-12-01 | Disposition: A | Discharge status: Home / Self Care

## 2024-12-01 ENCOUNTER — Encounter (HOSPITAL_COMMUNITY): Payer: Self-pay

## 2024-12-01 DIAGNOSIS — R10A1 Flank pain, right side: Secondary | ICD-10-CM | POA: Diagnosis present

## 2024-12-01 DIAGNOSIS — W010XXA Fall on same level from slipping, tripping and stumbling without subsequent striking against object, initial encounter: Secondary | ICD-10-CM | POA: Insufficient documentation

## 2024-12-01 DIAGNOSIS — Y990 Civilian activity done for income or pay: Secondary | ICD-10-CM | POA: Insufficient documentation

## 2024-12-01 DIAGNOSIS — Y9302 Activity, running: Secondary | ICD-10-CM | POA: Insufficient documentation

## 2024-12-01 DIAGNOSIS — W19XXXA Unspecified fall, initial encounter: Secondary | ICD-10-CM

## 2024-12-01 MED ORDER — ACETAMINOPHEN 325 MG PO TABS
650.0000 mg | ORAL_TABLET | Freq: Once | ORAL | Status: AC
  Administered 2024-12-01: 650 mg via ORAL
  Filled 2024-12-01: qty 2

## 2024-12-01 NOTE — ED Triage Notes (Signed)
 Pt presents to the ED for a fall that happened Saturday night at work. Pt believes he tripped over water hose in the back of the kitchen and landed on right side. Denies N/V, denies hitting his head or LOC. Pt ambulatory into triage.

## 2024-12-01 NOTE — Discharge Instructions (Signed)
 Follow-up with your primary care have any pain.  If you start to have any shortness of breath, chest pain, abdominal pain or any other symptoms return to the ER.  Alternating between 650 mg Tylenol  and 400 mg Advil : The best way to alternate taking Acetaminophen  (example Tylenol ) and Ibuprofen  (example Advil /Motrin ) is to take them 3 hours apart. For example, if you take ibuprofen  at 6 am you can then take Tylenol  at 9 am. You can continue this regimen throughout the day, making sure you do not exceed the recommended maximum dose for each drug.

## 2024-12-01 NOTE — ED Provider Notes (Signed)
 " [place] EMERGENCY DEPARTMENT AT [facility] Provider Note   CSN: [id number] Arrival date & time: 12/01/24  9095     Patient presents with: Felton Kuba T Torre is a 53 y.o. male.    Fall  53 year old male presenting after a fall.  Patient reports that his fall happened on Saturday while at work.  He was running when he tripped over.  Denies hitting his head, any loss of consciousness, nausea vomiting, or blurred vision.  He reports that the only pain that he is having is on his right flank.  He denies being on any blood thinners.  He denies any other pain.     Prior to Admission medications  Medication Sig Start Date End Date Taking? Authorizing Provider  acetaminophen  (TYLENOL ) 325 MG tablet Take 2 tablets (650 mg total) by mouth every 6 (six) hours as needed for mild pain, fever or headache. 12/19/21   Vicci Burnard SAUNDERS, PA-C  atorvastatin (LIPITOR) 10 MG tablet Take 10 mg by mouth at bedtime.    [provider]  cetirizine (ZYRTEC) 10 MG tablet Take 10 mg by mouth daily.    [provider]  chlorpheniramine-HYDROcodone (TUSSIONEX) 10-8 MG/5ML Take 5 mLs by mouth at bedtime as needed for cough. 01/08/24   Prosperi, Christian H, PA-C  Cholecalciferol (VITAMIN D3) 10 MCG (400 UNIT) CAPS Take 1 tablet by mouth daily.    [provider]  ibuprofen  (ADVIL ) 200 MG tablet Take 2 tablets (400 mg total) by mouth every 6 (six) hours as needed for moderate pain. 12/19/21   Vicci Burnard SAUNDERS, PA-C  predniSONE  (DELTASONE ) 20 MG tablet Take 2 tablets (40 mg total) by mouth daily. 01/08/24   Prosperi, Christian H, PA-C  traMADol  (ULTRAM ) 50 MG tablet Take 1 tablet (50 mg total) by mouth every 6 (six) hours as needed for severe pain or moderate pain. 12/19/21   Vicci Burnard SAUNDERS, PA-C    Allergies: Bee venom and Oxycodone    Review of Systems  All other systems reviewed and are negative.   Updated Vital Signs BP (!) 143/90 (BP Location: Left Arm)   Pulse 90    Temp 98.2 F (36.8 C) (Oral)   Resp 16   SpO2 100%   Physical Exam Vitals and nursing note reviewed.  Eyes:     General: No scleral icterus. Pulmonary:     Effort: Pulmonary effort is normal.  Skin:    Coloration: Skin is not jaundiced.     Findings: Bruising and signs of injury present. No abrasion, abscess, burn, erythema, laceration, lesion or rash.         Comments: Very large bruising noted to the right flank.  Pain to palpation over the right flank.  No cervical, thoracic or lumbar tenderness to palpation.  Neurological:     General: No focal deficit present.     Mental Status: He is alert.     (all labs ordered are listed, but only abnormal results are displayed) Labs Reviewed - No data to display  EKG: None  Radiology: DG Ribs Unilateral W/Chest Right Result Date: 12/01/2024 CLINICAL DATA:  Fall 2 days ago with right lateral/posterior lower rib pain and bruising. EXAM: RIGHT RIBS AND CHEST - 3+ VIEW COMPARISON:  05/06/2018. FINDINGS: No acute displaced fracture or other bone lesions are seen involving the ribs. There is no evidence of pneumothorax or pleural effusion. Mild atelectasis is noted at the left lung base. Heart size and mediastinal contours are within  normal limits. IMPRESSION: 1. No acute displaced rib fracture. 2. Mild atelectasis at the left lung base. Electronically Signed   By: Leita Birmingham M.D.   On: 12/01/2024 10:35     Procedures   Medications Ordered in the ED  acetaminophen  (TYLENOL ) tablet 650 mg (650 mg Oral Given 12/01/24 0951)                                    Medical Decision Making  Impression: 53 year old male presenting after a fall.  Differential diagnoses include rib fracture, hematoma, contusion  Additional History: Patient provided history with her outpatient notes.  Labs: Due to the stability of the patient no labs were ordered today.  Imaging: Due to pain in the right side x-ray of the ribs were ordered.  I personally  interpreted and saw no fractures.  ED Course/Meds: Patient remained stable while in the ER.  He received Tylenol  for pain.  X-ray of his ribs showed no abnormalities.  Patient had no other signs of any trauma.  Patient had no tenderness to his spine.  Patient was able to move well.  Patient was educated to follow-up with primary care if symptoms continue.  If symptoms worsen and he starts to develop any chest pain, shortness of breath, abdominal pain to return to the ER.      Final diagnoses:  Fall, initial encounter    ED Discharge Orders     None          Rosaline Almarie KANDICE DEVONNA 12/01/24 1057    Ula Prentice SAUNDERS, MD 12/01/24 1422  "
